# Patient Record
Sex: Female | Born: 1975
Health system: Southern US, Community
[De-identification: ages and names within clinical notes are randomized; demographics above are authoritative.]

## PROBLEM LIST (undated history)

## (undated) DIAGNOSIS — E7800 Pure hypercholesterolemia, unspecified: Secondary | ICD-10-CM

## (undated) DIAGNOSIS — K219 Gastro-esophageal reflux disease without esophagitis: Secondary | ICD-10-CM

## (undated) DIAGNOSIS — I10 Essential (primary) hypertension: Secondary | ICD-10-CM

## (undated) DIAGNOSIS — Z8041 Family history of malignant neoplasm of ovary: Secondary | ICD-10-CM

## (undated) DIAGNOSIS — E876 Hypokalemia: Secondary | ICD-10-CM

## (undated) DIAGNOSIS — E66812 Obesity, class 2: Secondary | ICD-10-CM

## (undated) HISTORY — PX: COLONOSCOPY W/ POLYPECTOMY: SHX1380

## (undated) HISTORY — DX: Pure hypercholesterolemia, unspecified: E78.00

## (undated) HISTORY — DX: Gastro-esophageal reflux disease without esophagitis: K21.9

## (undated) HISTORY — PX: ESOPHAGOGASTRODUODENOSCOPY: SHX1529

## (undated) HISTORY — DX: Obesity, class 2: E66.812

## (undated) HISTORY — DX: Essential (primary) hypertension: I10

## (undated) HISTORY — DX: Family history of malignant neoplasm of ovary: Z80.41

## (undated) HISTORY — PX: OTHER SURGICAL HISTORY: SHX169

## (undated) HISTORY — DX: Hypokalemia: E87.6

---

## 1998-02-26 HISTORY — PX: CHOLECYSTECTOMY: SHX55

## 2014-03-22 DIAGNOSIS — Z8041 Family history of malignant neoplasm of ovary: Secondary | ICD-10-CM | POA: Insufficient documentation

## 2015-02-15 DIAGNOSIS — R079 Chest pain, unspecified: Secondary | ICD-10-CM | POA: Insufficient documentation

## 2018-05-24 MED FILL — TRI FEMYNOR 28 TABLET: 0.18/0.215/ | 84 days supply | Qty: 84 | Fill #0

## 2018-08-11 DIAGNOSIS — Z01419 Encounter for gynecological examination (general) (routine) without abnormal findings: Secondary | ICD-10-CM | POA: Diagnosis not present

## 2018-08-11 DIAGNOSIS — Z1273 Encounter for screening for malignant neoplasm of ovary: Secondary | ICD-10-CM | POA: Diagnosis not present

## 2018-08-11 DIAGNOSIS — Z8041 Family history of malignant neoplasm of ovary: Secondary | ICD-10-CM | POA: Diagnosis not present

## 2018-08-11 DIAGNOSIS — Z1502 Genetic susceptibility to malignant neoplasm of ovary: Secondary | ICD-10-CM | POA: Diagnosis not present

## 2018-08-11 MED FILL — TRI FEMYNOR 28 TABLET: 0.18/0.215/ | 84 days supply | Qty: 84 | Fill #0

## 2018-10-08 DIAGNOSIS — Z1231 Encounter for screening mammogram for malignant neoplasm of breast: Secondary | ICD-10-CM | POA: Diagnosis not present

## 2018-12-01 MED FILL — NORGESTIM-ETH ESTRAD TRIPHA: 0.18/0.215/ | 84 days supply | Qty: 84 | Fill #1

## 2018-12-12 DIAGNOSIS — S61202A Unspecified open wound of right middle finger without damage to nail, initial encounter: Secondary | ICD-10-CM | POA: Diagnosis not present

## 2018-12-15 DIAGNOSIS — Z130 Encounter for screening for diseases of the blood and blood-forming organs and certain disorders involving the immune mechanism: Secondary | ICD-10-CM | POA: Diagnosis not present

## 2018-12-15 DIAGNOSIS — Z0000 Encounter for general adult medical examination without abnormal findings: Secondary | ICD-10-CM | POA: Diagnosis not present

## 2018-12-15 DIAGNOSIS — Z13220 Encounter for screening for lipoid disorders: Secondary | ICD-10-CM | POA: Diagnosis not present

## 2018-12-15 DIAGNOSIS — Z1329 Encounter for screening for other suspected endocrine disorder: Secondary | ICD-10-CM | POA: Diagnosis not present

## 2018-12-15 DIAGNOSIS — Z13228 Encounter for screening for other metabolic disorders: Secondary | ICD-10-CM | POA: Diagnosis not present

## 2018-12-29 DIAGNOSIS — Z136 Encounter for screening for cardiovascular disorders: Secondary | ICD-10-CM | POA: Diagnosis not present

## 2018-12-29 DIAGNOSIS — Z23 Encounter for immunization: Secondary | ICD-10-CM | POA: Diagnosis not present

## 2018-12-29 DIAGNOSIS — R079 Chest pain, unspecified: Secondary | ICD-10-CM | POA: Diagnosis not present

## 2018-12-29 DIAGNOSIS — Z0000 Encounter for general adult medical examination without abnormal findings: Secondary | ICD-10-CM | POA: Diagnosis not present

## 2018-12-29 LAB — VITAMIN B12: Vitamin B-12: 200

## 2018-12-30 ENCOUNTER — Other Ambulatory Visit (HOSPITAL_BASED_OUTPATIENT_CLINIC_OR_DEPARTMENT_OTHER): Payer: Self-pay | Admitting: Family Medicine

## 2018-12-30 DIAGNOSIS — M7989 Other specified soft tissue disorders: Secondary | ICD-10-CM

## 2018-12-30 DIAGNOSIS — I498 Other specified cardiac arrhythmias: Secondary | ICD-10-CM | POA: Diagnosis not present

## 2018-12-30 DIAGNOSIS — M25572 Pain in left ankle and joints of left foot: Secondary | ICD-10-CM

## 2018-12-30 DIAGNOSIS — S93402A Sprain of unspecified ligament of left ankle, initial encounter: Secondary | ICD-10-CM

## 2018-12-31 ENCOUNTER — Ambulatory Visit (HOSPITAL_BASED_OUTPATIENT_CLINIC_OR_DEPARTMENT_OTHER)
Admission: RE | Admit: 2018-12-31 | Discharge: 2018-12-31 | Disposition: A | Discharge status: Home / Self Care | Payer: 59 | Source: Ambulatory Visit | Attending: Family Medicine | Admitting: Family Medicine

## 2018-12-31 ENCOUNTER — Other Ambulatory Visit: Payer: Self-pay

## 2018-12-31 DIAGNOSIS — M7989 Other specified soft tissue disorders: Secondary | ICD-10-CM | POA: Insufficient documentation

## 2018-12-31 DIAGNOSIS — M25572 Pain in left ankle and joints of left foot: Secondary | ICD-10-CM | POA: Diagnosis not present

## 2018-12-31 DIAGNOSIS — S93402A Sprain of unspecified ligament of left ankle, initial encounter: Secondary | ICD-10-CM | POA: Diagnosis not present

## 2018-12-31 DIAGNOSIS — R2231 Localized swelling, mass and lump, right upper limb: Secondary | ICD-10-CM | POA: Diagnosis not present

## 2019-01-20 DIAGNOSIS — R2231 Localized swelling, mass and lump, right upper limb: Secondary | ICD-10-CM | POA: Diagnosis not present

## 2019-01-20 DIAGNOSIS — D2111 Benign neoplasm of connective and other soft tissue of right upper limb, including shoulder: Secondary | ICD-10-CM | POA: Diagnosis not present

## 2019-02-11 MED FILL — NORGESTIM-ETH ESTRAD TRIPHA: 0.18/0.215/ | 84 days supply | Qty: 84 | Fill #2

## 2019-03-25 DIAGNOSIS — H524 Presbyopia: Secondary | ICD-10-CM | POA: Diagnosis not present

## 2019-03-25 DIAGNOSIS — H52221 Regular astigmatism, right eye: Secondary | ICD-10-CM | POA: Diagnosis not present

## 2019-04-23 DIAGNOSIS — M722 Plantar fascial fibromatosis: Secondary | ICD-10-CM | POA: Diagnosis not present

## 2019-04-23 DIAGNOSIS — M7752 Other enthesopathy of left foot: Secondary | ICD-10-CM | POA: Diagnosis not present

## 2019-04-23 DIAGNOSIS — M7732 Calcaneal spur, left foot: Secondary | ICD-10-CM | POA: Diagnosis not present

## 2019-04-28 DIAGNOSIS — Z8041 Family history of malignant neoplasm of ovary: Secondary | ICD-10-CM | POA: Diagnosis not present

## 2019-06-26 MED FILL — NORGESTIM-ETH ESTRAD TRIPHA: 0.18/0.215/ | 84 days supply | Qty: 84 | Fill #3

## 2019-09-21 ENCOUNTER — Other Ambulatory Visit (HOSPITAL_COMMUNITY): Payer: Self-pay | Admitting: Obstetrics & Gynecology

## 2019-09-21 DIAGNOSIS — Z124 Encounter for screening for malignant neoplasm of cervix: Secondary | ICD-10-CM | POA: Diagnosis not present

## 2019-09-21 DIAGNOSIS — D259 Leiomyoma of uterus, unspecified: Secondary | ICD-10-CM | POA: Diagnosis not present

## 2019-09-21 DIAGNOSIS — Z8041 Family history of malignant neoplasm of ovary: Secondary | ICD-10-CM | POA: Diagnosis not present

## 2019-09-21 DIAGNOSIS — Z01419 Encounter for gynecological examination (general) (routine) without abnormal findings: Secondary | ICD-10-CM | POA: Diagnosis not present

## 2019-09-21 MED FILL — NORGESTIM-ETH ESTRAD TRIPHA: 0.18/0.215/ | 84 days supply | Qty: 84 | Fill #0

## 2019-10-08 DIAGNOSIS — M722 Plantar fascial fibromatosis: Secondary | ICD-10-CM | POA: Diagnosis not present

## 2019-10-08 DIAGNOSIS — M7732 Calcaneal spur, left foot: Secondary | ICD-10-CM | POA: Diagnosis not present

## 2019-10-08 DIAGNOSIS — M7752 Other enthesopathy of left foot: Secondary | ICD-10-CM | POA: Diagnosis not present

## 2019-11-05 DIAGNOSIS — M7752 Other enthesopathy of left foot: Secondary | ICD-10-CM | POA: Diagnosis not present

## 2019-11-05 DIAGNOSIS — M722 Plantar fascial fibromatosis: Secondary | ICD-10-CM | POA: Diagnosis not present

## 2019-11-05 DIAGNOSIS — M7732 Calcaneal spur, left foot: Secondary | ICD-10-CM | POA: Diagnosis not present

## 2019-11-12 DIAGNOSIS — Z1231 Encounter for screening mammogram for malignant neoplasm of breast: Secondary | ICD-10-CM | POA: Diagnosis not present

## 2019-11-28 IMAGING — US US EXTREM UP *R* LTD
1 series · 13 of 13 positions shown · non-contrast
Comparison: None.

CLINICAL DATA: Soft tissue mass of the right elbow.

EXAM:
ULTRASOUND RIGHT UPPER EXTREMITY LIMITED
TECHNIQUE: Ultrasound examination of the upper extremity soft tissues was
performed in the area of clinical concern.

[Series 1: us extrem up *right* ltd · 13 acquisitions, 13 frames shown]
[im 1/13]
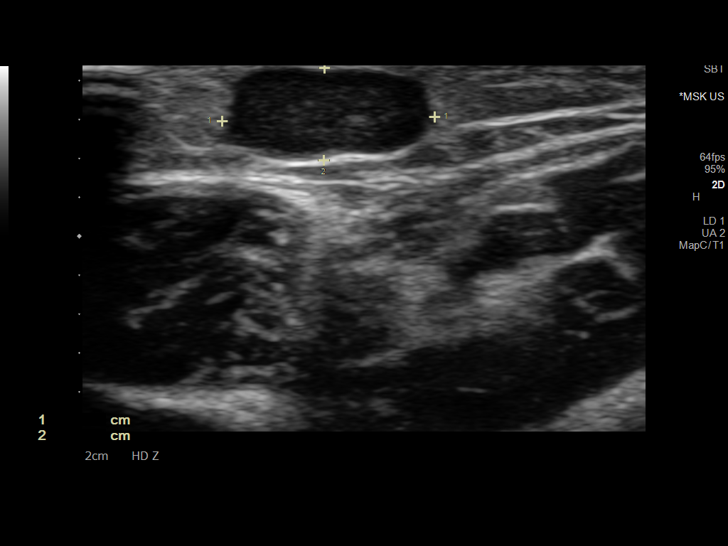
[im 2/13]
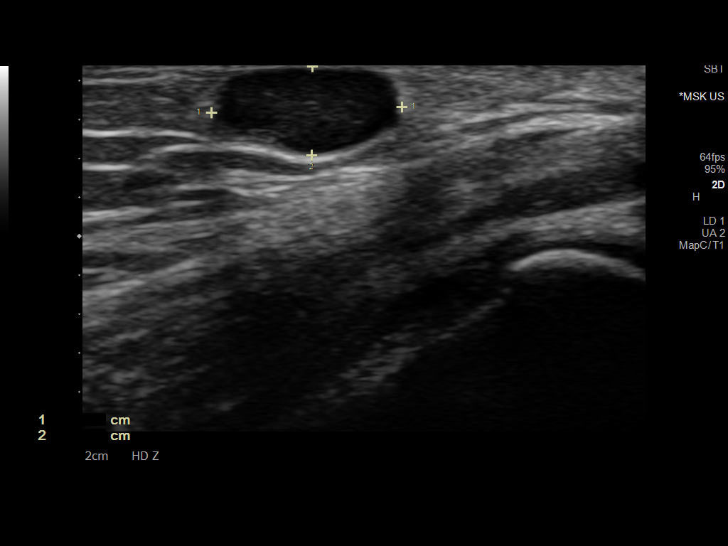
[im 3/13]
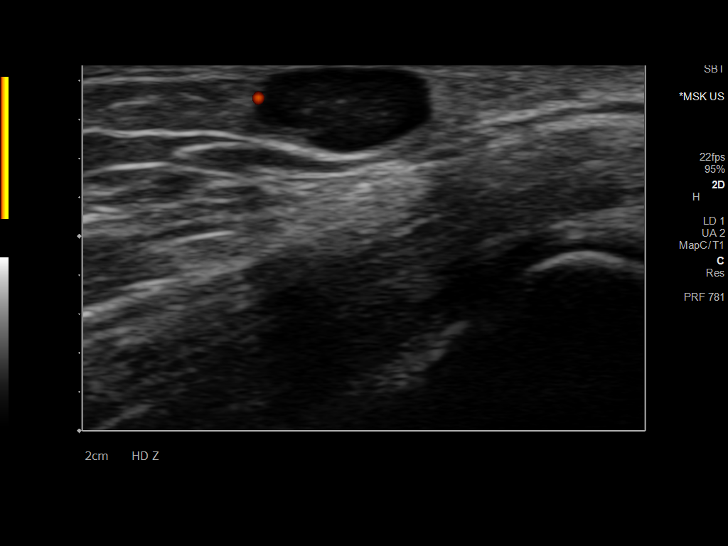
[im 4/13]
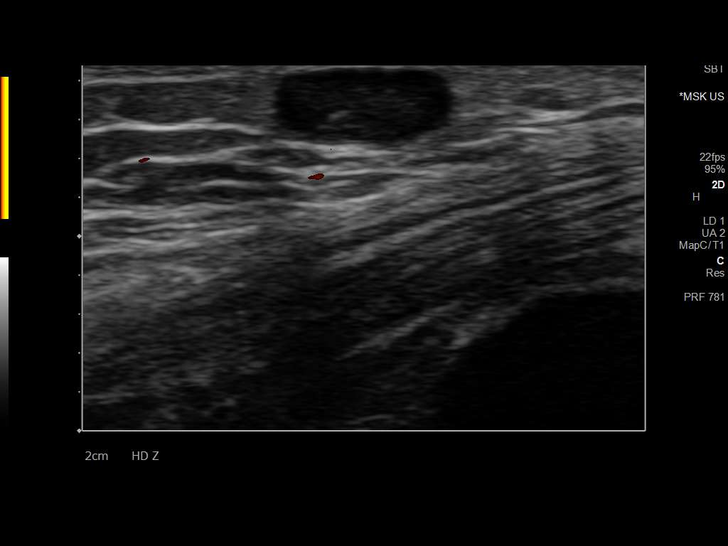
[im 5/13]
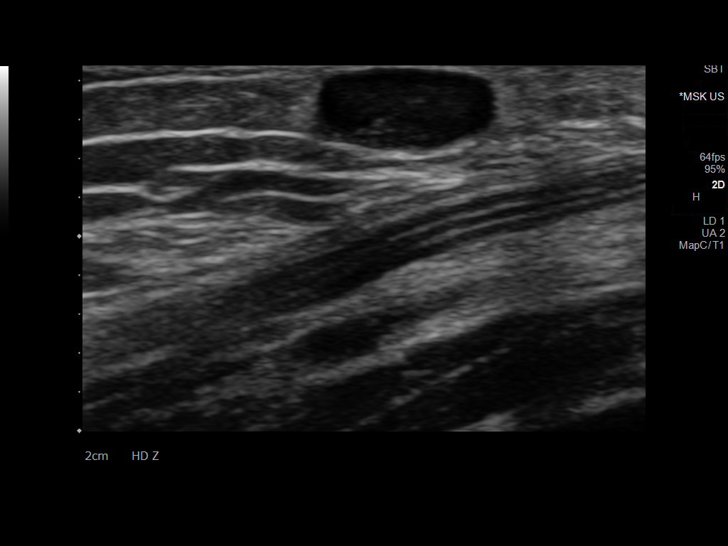
[im 6/13]
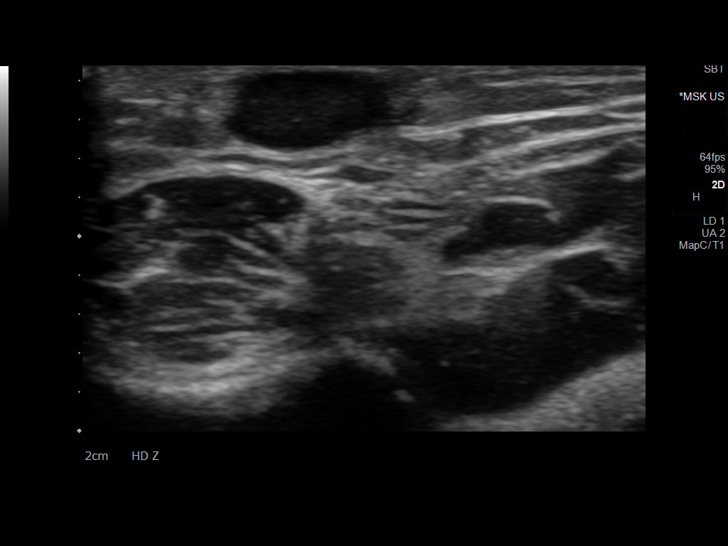
[im 7/13]
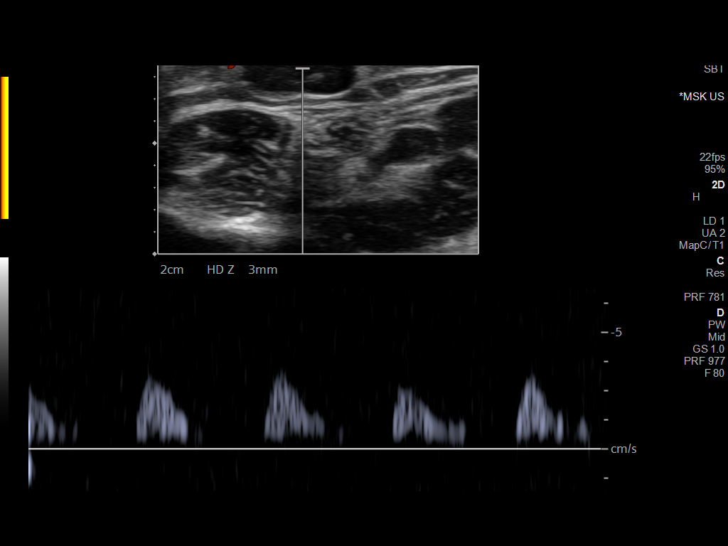
[im 8/13]
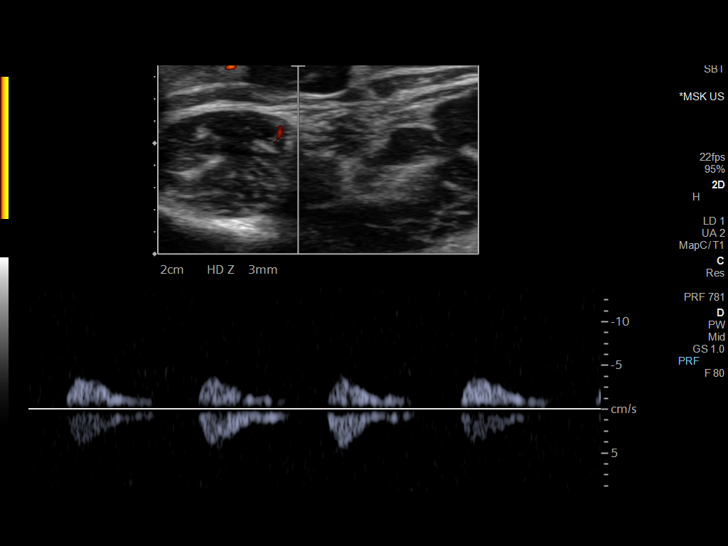
[im 9/13]
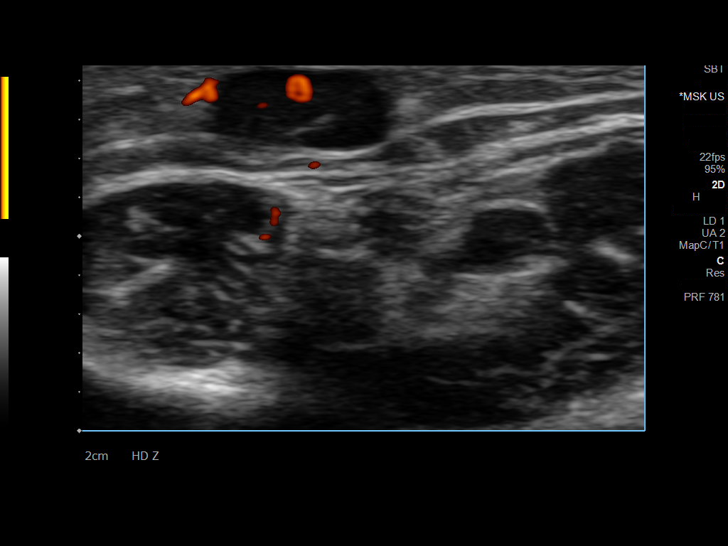
[im 10/13]
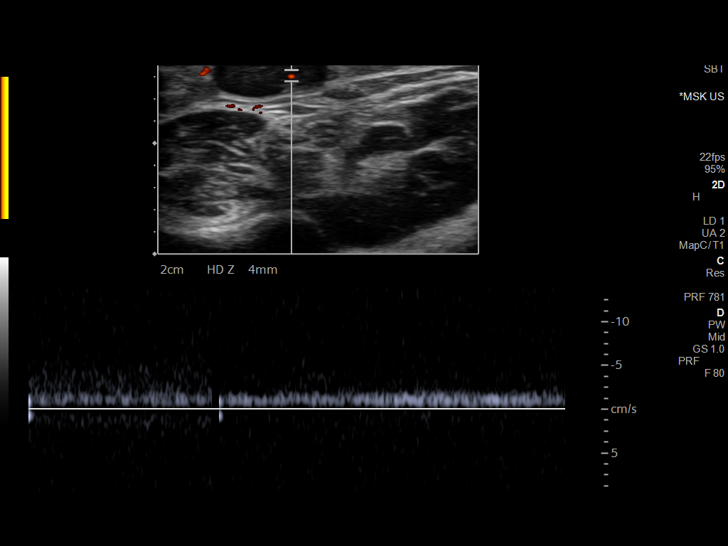
[im 11/13]
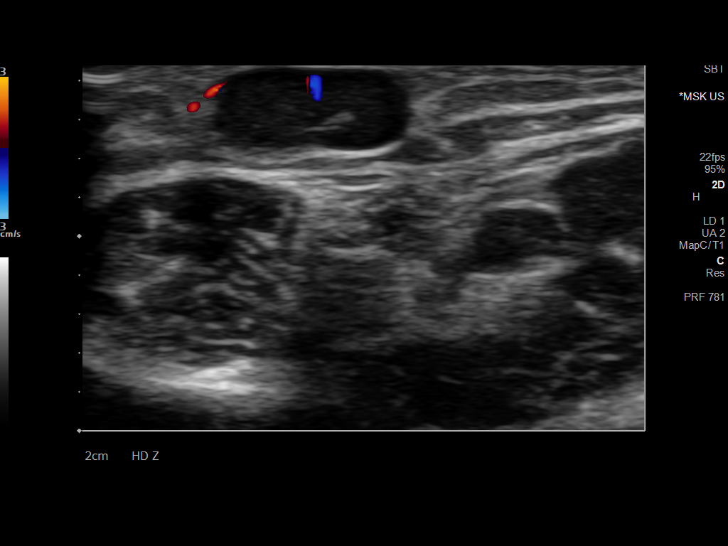
[im 12/13]
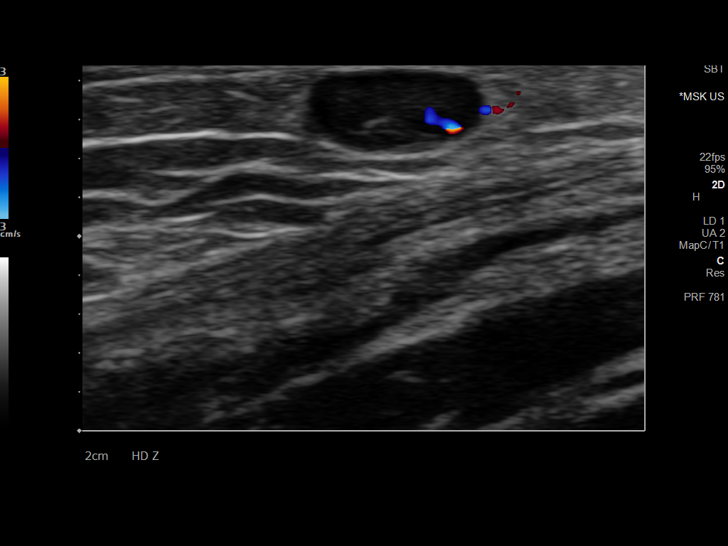
[im 13/13]
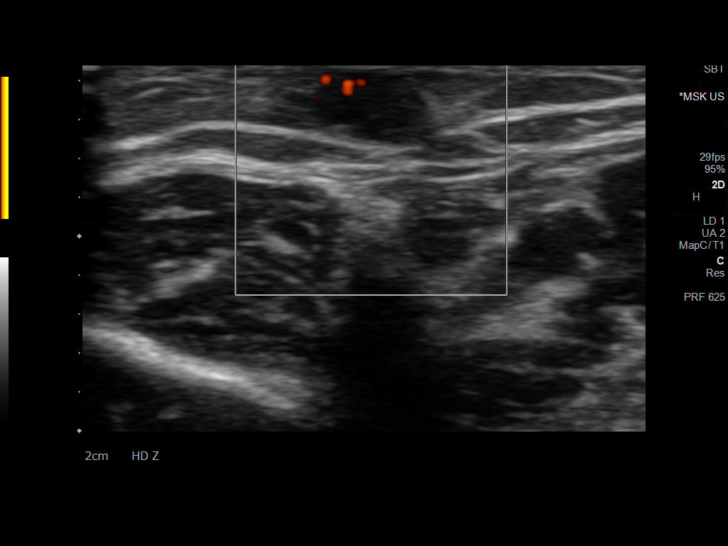

[13 of 13 positions shown; findings below may reference images not displayed]

FINDINGS: There is a well-defined 1.1 x 1.0 x 0.5 cm hypoechoic vascular mass
in the subcutaneous fat at the posterior aspect of the right elbow.
There is a tiny fatty hilum suggestive of a lymph node.

No other abnormality.
IMPRESSION: The soft tissue mass at the right elbow is consistent with an
enlarged lymph node.

## 2019-12-10 DIAGNOSIS — M7732 Calcaneal spur, left foot: Secondary | ICD-10-CM | POA: Diagnosis not present

## 2019-12-10 DIAGNOSIS — M722 Plantar fascial fibromatosis: Secondary | ICD-10-CM | POA: Diagnosis not present

## 2019-12-21 MED FILL — NORGESTIM-ETH ESTRAD TRIPHA: 0.18/0.215/ | 84 days supply | Qty: 84 | Fill #1

## 2019-12-22 ENCOUNTER — Other Ambulatory Visit: Payer: Self-pay

## 2019-12-22 DIAGNOSIS — Z20822 Contact with and (suspected) exposure to covid-19: Secondary | ICD-10-CM | POA: Diagnosis not present

## 2019-12-24 LAB — SARS-COV-2, NAA 2 DAY TAT

## 2019-12-24 LAB — NOVEL CORONAVIRUS, NAA: SARS-CoV-2, NAA: NOT DETECTED

## 2019-12-25 DIAGNOSIS — Z131 Encounter for screening for diabetes mellitus: Secondary | ICD-10-CM | POA: Diagnosis not present

## 2019-12-25 DIAGNOSIS — Z130 Encounter for screening for diseases of the blood and blood-forming organs and certain disorders involving the immune mechanism: Secondary | ICD-10-CM | POA: Diagnosis not present

## 2019-12-25 DIAGNOSIS — Z0001 Encounter for general adult medical examination with abnormal findings: Secondary | ICD-10-CM | POA: Diagnosis not present

## 2019-12-25 DIAGNOSIS — Z13220 Encounter for screening for lipoid disorders: Secondary | ICD-10-CM | POA: Diagnosis not present

## 2019-12-25 DIAGNOSIS — Z13228 Encounter for screening for other metabolic disorders: Secondary | ICD-10-CM | POA: Diagnosis not present

## 2019-12-25 DIAGNOSIS — Z0000 Encounter for general adult medical examination without abnormal findings: Secondary | ICD-10-CM | POA: Diagnosis not present

## 2019-12-25 DIAGNOSIS — K219 Gastro-esophageal reflux disease without esophagitis: Secondary | ICD-10-CM | POA: Diagnosis not present

## 2019-12-25 DIAGNOSIS — Z1321 Encounter for screening for nutritional disorder: Secondary | ICD-10-CM | POA: Diagnosis not present

## 2019-12-25 DIAGNOSIS — Z1329 Encounter for screening for other suspected endocrine disorder: Secondary | ICD-10-CM | POA: Diagnosis not present

## 2019-12-25 DIAGNOSIS — Z79899 Other long term (current) drug therapy: Secondary | ICD-10-CM | POA: Diagnosis not present

## 2020-01-11 DIAGNOSIS — R9431 Abnormal electrocardiogram [ECG] [EKG]: Secondary | ICD-10-CM | POA: Diagnosis not present

## 2020-01-11 DIAGNOSIS — Z0000 Encounter for general adult medical examination without abnormal findings: Secondary | ICD-10-CM | POA: Diagnosis not present

## 2020-02-17 DIAGNOSIS — Z03818 Encounter for observation for suspected exposure to other biological agents ruled out: Secondary | ICD-10-CM | POA: Diagnosis not present

## 2020-02-17 DIAGNOSIS — Z20822 Contact with and (suspected) exposure to covid-19: Secondary | ICD-10-CM | POA: Diagnosis not present

## 2020-02-17 DIAGNOSIS — Z792 Long term (current) use of antibiotics: Secondary | ICD-10-CM | POA: Diagnosis not present

## 2020-03-12 MED FILL — NORGESTIM-ETH ESTRAD TRIPHA: 0.18/0.215/ | 84 days supply | Qty: 84 | Fill #2

## 2020-03-17 ENCOUNTER — Ambulatory Visit: Payer: Self-pay

## 2020-03-17 ENCOUNTER — Other Ambulatory Visit: Payer: Self-pay | Admitting: Family Medicine

## 2020-03-17 ENCOUNTER — Other Ambulatory Visit: Payer: Self-pay

## 2020-03-17 DIAGNOSIS — M79645 Pain in left finger(s): Secondary | ICD-10-CM

## 2020-03-21 DIAGNOSIS — Z8041 Family history of malignant neoplasm of ovary: Secondary | ICD-10-CM | POA: Diagnosis not present

## 2020-03-21 DIAGNOSIS — Z7689 Persons encountering health services in other specified circumstances: Secondary | ICD-10-CM | POA: Diagnosis not present

## 2020-03-28 MED FILL — NORGESTIM-ETH ESTRAD TRIPHA: 0.18/0.215/ | 84 days supply | Qty: 84 | Fill #2

## 2020-04-06 DIAGNOSIS — H52223 Regular astigmatism, bilateral: Secondary | ICD-10-CM | POA: Diagnosis not present

## 2020-04-06 DIAGNOSIS — H524 Presbyopia: Secondary | ICD-10-CM | POA: Diagnosis not present

## 2020-07-05 ENCOUNTER — Other Ambulatory Visit (HOSPITAL_COMMUNITY): Payer: Self-pay

## 2020-07-05 MED FILL — Norgestimate-Eth Estrad Tab 0.18-35/0.215-35/0.25-35 MG-MCG: ORAL | 84 days supply | Qty: 84 | Fill #0 | Status: AC

## 2020-07-06 ENCOUNTER — Other Ambulatory Visit (HOSPITAL_COMMUNITY): Payer: Self-pay

## 2020-07-14 ENCOUNTER — Other Ambulatory Visit (HOSPITAL_COMMUNITY): Payer: Self-pay

## 2020-07-14 MED ORDER — CARESTART COVID-19 HOME TEST VI KIT
PACK | 0 refills | Status: DC
  Filled 2020-07-14: qty 4, 4d supply, fill #0

## 2020-08-04 LAB — RESULTS CONSOLE HPV: CHL HPV: NEGATIVE

## 2020-10-04 DIAGNOSIS — Z1273 Encounter for screening for malignant neoplasm of ovary: Secondary | ICD-10-CM | POA: Diagnosis not present

## 2020-10-04 DIAGNOSIS — Z124 Encounter for screening for malignant neoplasm of cervix: Secondary | ICD-10-CM | POA: Diagnosis not present

## 2020-10-04 DIAGNOSIS — Z8041 Family history of malignant neoplasm of ovary: Secondary | ICD-10-CM | POA: Diagnosis not present

## 2020-10-04 DIAGNOSIS — Z01419 Encounter for gynecological examination (general) (routine) without abnormal findings: Secondary | ICD-10-CM | POA: Diagnosis not present

## 2020-10-04 DIAGNOSIS — Z6835 Body mass index (BMI) 35.0-35.9, adult: Secondary | ICD-10-CM | POA: Diagnosis not present

## 2020-10-04 DIAGNOSIS — Z1151 Encounter for screening for human papillomavirus (HPV): Secondary | ICD-10-CM | POA: Diagnosis not present

## 2020-10-04 LAB — HM PAP SMEAR

## 2020-10-07 LAB — HM PAP SMEAR
HPV Aptima: NEGATIVE
Pap: NEGATIVE
Pap: NEGATIVE

## 2020-10-13 DIAGNOSIS — I10 Essential (primary) hypertension: Secondary | ICD-10-CM | POA: Diagnosis not present

## 2020-11-01 DIAGNOSIS — I10 Essential (primary) hypertension: Secondary | ICD-10-CM | POA: Diagnosis not present

## 2020-11-08 ENCOUNTER — Other Ambulatory Visit (HOSPITAL_COMMUNITY): Payer: Self-pay

## 2020-11-08 MED ORDER — CARESTART COVID-19 HOME TEST VI KIT
PACK | 0 refills | Status: DC
  Filled 2020-11-08 – 2020-12-01 (×2): qty 4, 4d supply, fill #0

## 2020-11-17 ENCOUNTER — Other Ambulatory Visit (HOSPITAL_COMMUNITY): Payer: Self-pay

## 2020-11-17 MED ORDER — LOSARTAN POTASSIUM-HCTZ 50-12.5 MG PO TABS
1.0000 | ORAL_TABLET | Freq: Every day | ORAL | 2 refills | Status: DC
  Filled 2020-11-17 – 2020-12-01 (×2): qty 30, 30d supply, fill #0

## 2020-11-25 ENCOUNTER — Other Ambulatory Visit (HOSPITAL_COMMUNITY): Payer: Self-pay

## 2020-12-01 ENCOUNTER — Other Ambulatory Visit (HOSPITAL_COMMUNITY): Payer: Self-pay

## 2021-01-09 DIAGNOSIS — Z1329 Encounter for screening for other suspected endocrine disorder: Secondary | ICD-10-CM | POA: Diagnosis not present

## 2021-01-09 DIAGNOSIS — I10 Essential (primary) hypertension: Secondary | ICD-10-CM | POA: Diagnosis not present

## 2021-01-09 DIAGNOSIS — Z1321 Encounter for screening for nutritional disorder: Secondary | ICD-10-CM | POA: Diagnosis not present

## 2021-01-09 DIAGNOSIS — Z13220 Encounter for screening for lipoid disorders: Secondary | ICD-10-CM | POA: Diagnosis not present

## 2021-01-09 DIAGNOSIS — Z131 Encounter for screening for diabetes mellitus: Secondary | ICD-10-CM | POA: Diagnosis not present

## 2021-01-09 DIAGNOSIS — Z0000 Encounter for general adult medical examination without abnormal findings: Secondary | ICD-10-CM | POA: Diagnosis not present

## 2021-01-09 LAB — CBC AND DIFFERENTIAL
HCT: 40 (ref 36–46)
Hemoglobin: 13.4 (ref 12.0–16.0)
Neutrophils Absolute: 3.5
Platelets: 150 10*3/uL (ref 150–400)
WBC: 6.2

## 2021-01-09 LAB — HEMOGLOBIN A1C: Hemoglobin A1C: 5.5

## 2021-01-09 LAB — BASIC METABOLIC PANEL
BUN: 10 (ref 4–21)
CO2: 27 — AB (ref 13–22)
Chloride: 104 (ref 99–108)
Creatinine: 0.9 (ref 0.5–1.1)
Glucose: 69
Potassium: 3.9 mEq/L (ref 3.5–5.1)
Sodium: 139 (ref 137–147)

## 2021-01-09 LAB — COMPREHENSIVE METABOLIC PANEL
Albumin: 4.3 (ref 3.5–5.0)
Calcium: 9.5 (ref 8.7–10.7)
eGFR: 83

## 2021-01-09 LAB — HEPATIC FUNCTION PANEL
ALT: 16 U/L (ref 7–35)
AST: 18 (ref 13–35)
Alkaline Phosphatase: 56 (ref 25–125)
Bilirubin, Total: 0.5

## 2021-01-09 LAB — VITAMIN D 25 HYDROXY (VIT D DEFICIENCY, FRACTURES): Vit D, 25-Hydroxy: 26

## 2021-01-09 LAB — LIPID PANEL
Cholesterol: 182 (ref 0–200)
HDL: 57 (ref 35–70)
LDL Cholesterol: 114
Triglycerides: 75 (ref 40–160)

## 2021-01-09 LAB — CBC: RBC: 4.76 (ref 3.87–5.11)

## 2021-01-09 LAB — TSH: TSH: 1.72 (ref 0.41–5.90)

## 2021-01-12 ENCOUNTER — Other Ambulatory Visit (HOSPITAL_COMMUNITY): Payer: Self-pay

## 2021-01-12 DIAGNOSIS — Z0000 Encounter for general adult medical examination without abnormal findings: Secondary | ICD-10-CM | POA: Diagnosis not present

## 2021-01-12 DIAGNOSIS — Z136 Encounter for screening for cardiovascular disorders: Secondary | ICD-10-CM | POA: Diagnosis not present

## 2021-01-12 DIAGNOSIS — I10 Essential (primary) hypertension: Secondary | ICD-10-CM | POA: Diagnosis not present

## 2021-01-12 DIAGNOSIS — Z1231 Encounter for screening mammogram for malignant neoplasm of breast: Secondary | ICD-10-CM | POA: Diagnosis not present

## 2021-01-12 MED ORDER — HYDROCHLOROTHIAZIDE 12.5 MG PO CAPS
12.5000 mg | ORAL_CAPSULE | Freq: Every day | ORAL | 3 refills | Status: DC
  Filled 2021-01-12: qty 90, 90d supply, fill #0
  Filled 2021-12-27: qty 90, 90d supply, fill #1

## 2021-01-16 ENCOUNTER — Other Ambulatory Visit (HOSPITAL_BASED_OUTPATIENT_CLINIC_OR_DEPARTMENT_OTHER): Payer: Self-pay

## 2021-01-16 MED ORDER — OZEMPIC (0.25 OR 0.5 MG/DOSE) 2 MG/1.5ML ~~LOC~~ SOPN
PEN_INJECTOR | SUBCUTANEOUS | 3 refills | Status: DC
  Filled 2021-01-16: qty 1.5, 30d supply, fill #0
  Filled 2021-03-05: qty 4.5, 84d supply, fill #1

## 2021-02-12 IMAGING — DX DG FINGER RING 2+V*L*
3 series · 3 of 3 positions shown · non-contrast
Comparison: None.

CLINICAL DATA: Crush injury

EXAM:
LEFT FOURTH FINGER 2+V

[finger pa]
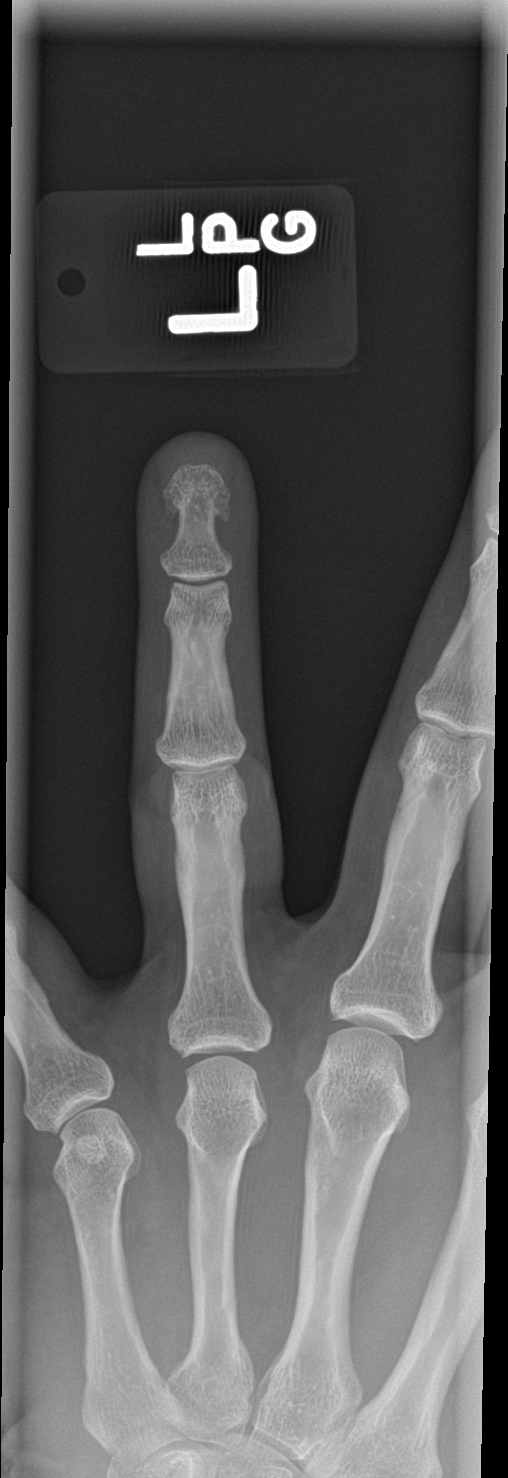

[finger obl]
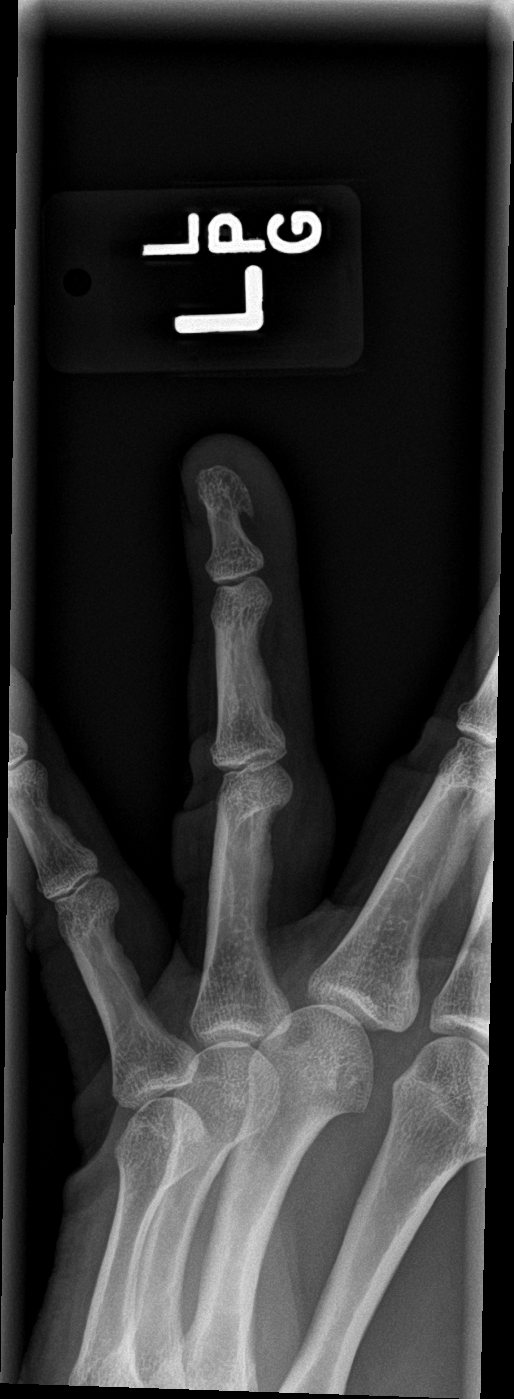

[finger lat]
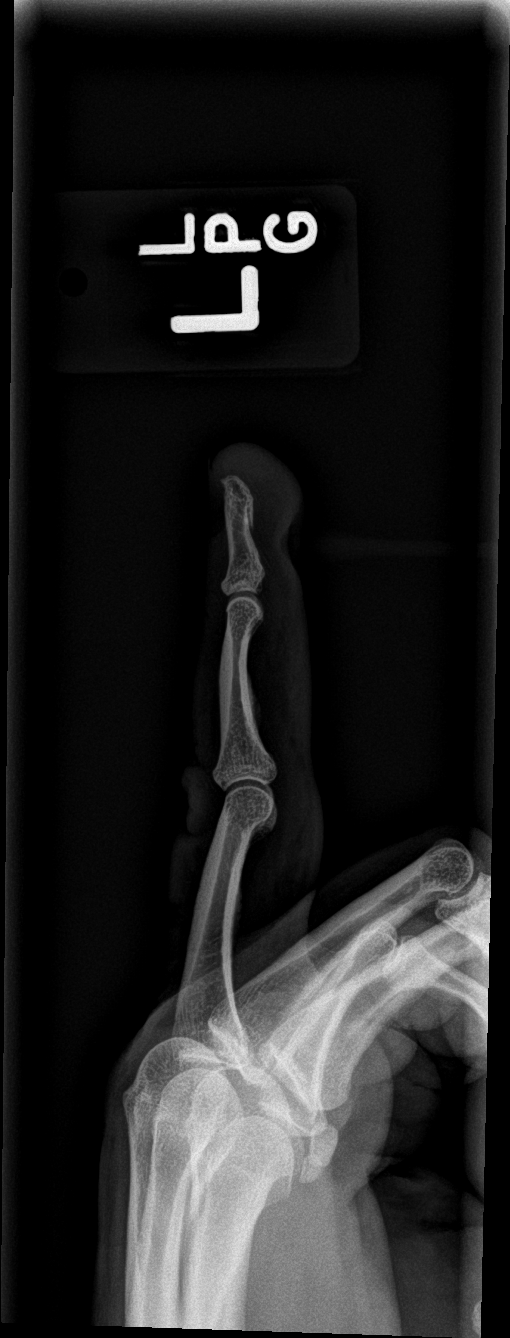

[3 of 3 positions shown; findings below may reference images not displayed]

FINDINGS: Frontal, oblique, and lateral views were obtained. No fracture or
dislocation. Joint spaces appear normal. No erosive change.
IMPRESSION: No fracture or dislocation.  No evident arthropathy.

## 2021-03-06 ENCOUNTER — Other Ambulatory Visit (HOSPITAL_BASED_OUTPATIENT_CLINIC_OR_DEPARTMENT_OTHER): Payer: Self-pay

## 2021-05-16 DIAGNOSIS — J0100 Acute maxillary sinusitis, unspecified: Secondary | ICD-10-CM | POA: Insufficient documentation

## 2021-05-16 DIAGNOSIS — I10 Essential (primary) hypertension: Secondary | ICD-10-CM | POA: Insufficient documentation

## 2021-05-25 DIAGNOSIS — Z8041 Family history of malignant neoplasm of ovary: Secondary | ICD-10-CM | POA: Diagnosis not present

## 2021-06-29 DIAGNOSIS — H5203 Hypermetropia, bilateral: Secondary | ICD-10-CM | POA: Diagnosis not present

## 2021-06-29 DIAGNOSIS — H52221 Regular astigmatism, right eye: Secondary | ICD-10-CM | POA: Diagnosis not present

## 2021-06-29 DIAGNOSIS — L570 Actinic keratosis: Secondary | ICD-10-CM | POA: Diagnosis not present

## 2021-06-29 DIAGNOSIS — L821 Other seborrheic keratosis: Secondary | ICD-10-CM | POA: Diagnosis not present

## 2021-06-29 DIAGNOSIS — L853 Xerosis cutis: Secondary | ICD-10-CM | POA: Diagnosis not present

## 2021-08-02 ENCOUNTER — Ambulatory Visit (INDEPENDENT_AMBULATORY_CARE_PROVIDER_SITE_OTHER): Payer: 59 | Admitting: Orthopaedic Surgery

## 2021-08-02 ENCOUNTER — Encounter: Payer: Self-pay | Admitting: Orthopaedic Surgery

## 2021-08-02 ENCOUNTER — Ambulatory Visit (INDEPENDENT_AMBULATORY_CARE_PROVIDER_SITE_OTHER): Payer: 59

## 2021-08-02 VITALS — Ht 63.75 in | Wt 192.6 lb

## 2021-08-02 DIAGNOSIS — M25552 Pain in left hip: Secondary | ICD-10-CM | POA: Diagnosis not present

## 2021-08-02 MED ORDER — DICLOFENAC SODIUM 75 MG PO TBEC: 75.0000 mg | DELAYED_RELEASE_TABLET | Freq: Two times a day (BID) | ORAL | 2 refills | Status: DC | PRN

## 2021-08-02 MED ORDER — PREDNISONE 50 MG PO TABS: ORAL_TABLET | ORAL | 0 refills | Status: DC

## 2021-08-02 NOTE — Progress Notes (Signed)
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  She comes in with acute left hip pain this been hurting since March when she was performing lunges and other squats and felt something happen in her left hip.  She has had some decreased motion and pain in the groin but also on the posterior lateral aspect of her left hip since then.  She had the groin pain is not as bad has been taken ibuprofen and try Voltaren gel.  She is also been taken some turmeric and is transition to Pilates instead of the higher impact exercises that she was performing.  In the past she has had diclofenac for her right hip issue did help that quite a bit.  She is heading out to [facility] soon in early July for hiking.  She has never had surgery on her hips either.  She is active and not a diabetic.  Both hips move smoothly and fluidly but the left hip is definitely painful at the extremes of rotation some in the groin but also in the posterior lateral aspect of the hip which can be concerning for partial tearing of the gluteus medius or minimus tendon.  Certainly there could be an issue with the labrum.  There is no blocks or rotation.  When I have her lay flat her leg lengths are equal.  When I have her turn on her side there is no significant pain over the IT band or the proximal trochanteric area except for the posterior aspect.  An AP pelvis and lateral of her left hip show normal appearing hip joint space bilaterally and no acute findings.  I am concerned there could be partial tearing of the gluteus medius or minimus tendon or something going on with the labrum.  We will start conservative first with 5 days of 50 mg prednisone as well as diclofenac.  My next step would her be considering steroid injections around her left hip 1 in the trochanteric area and potentially 1 in the hip joint itself.  She understands this as well and has my cell phone number and can text me if she decides to have any type of  injection.  All question concerns were answered and addressed.

## 2021-11-06 DIAGNOSIS — Z01419 Encounter for gynecological examination (general) (routine) without abnormal findings: Secondary | ICD-10-CM | POA: Diagnosis not present

## 2021-11-06 DIAGNOSIS — D251 Intramural leiomyoma of uterus: Secondary | ICD-10-CM | POA: Diagnosis not present

## 2021-11-06 DIAGNOSIS — N938 Other specified abnormal uterine and vaginal bleeding: Secondary | ICD-10-CM | POA: Diagnosis not present

## 2021-11-06 DIAGNOSIS — Z6833 Body mass index (BMI) 33.0-33.9, adult: Secondary | ICD-10-CM | POA: Diagnosis not present

## 2021-11-21 DIAGNOSIS — J209 Acute bronchitis, unspecified: Secondary | ICD-10-CM | POA: Diagnosis not present

## 2021-11-22 DIAGNOSIS — J209 Acute bronchitis, unspecified: Secondary | ICD-10-CM | POA: Diagnosis not present

## 2021-12-28 ENCOUNTER — Other Ambulatory Visit (HOSPITAL_COMMUNITY): Payer: Self-pay

## 2022-01-12 DIAGNOSIS — Z0000 Encounter for general adult medical examination without abnormal findings: Secondary | ICD-10-CM | POA: Diagnosis not present

## 2022-01-12 DIAGNOSIS — R079 Chest pain, unspecified: Secondary | ICD-10-CM | POA: Diagnosis not present

## 2022-01-16 ENCOUNTER — Other Ambulatory Visit (HOSPITAL_COMMUNITY): Payer: Self-pay

## 2022-01-16 MED ORDER — LOSARTAN POTASSIUM 100 MG PO TABS
100.0000 mg | ORAL_TABLET | Freq: Every day | ORAL | 3 refills | Status: DC
  Filled 2022-01-16: qty 90, 90d supply, fill #0

## 2022-02-01 ENCOUNTER — Other Ambulatory Visit (HOSPITAL_COMMUNITY): Payer: Self-pay

## 2022-03-07 ENCOUNTER — Encounter: Payer: Self-pay | Admitting: Gastroenterology

## 2022-03-13 LAB — HM MAMMOGRAPHY

## 2022-03-14 LAB — HM MAMMOGRAPHY

## 2022-03-30 ENCOUNTER — Encounter: Payer: Self-pay | Admitting: Family Medicine

## 2022-03-30 ENCOUNTER — Other Ambulatory Visit: Payer: Self-pay | Admitting: Family Medicine

## 2022-03-30 DIAGNOSIS — E876 Hypokalemia: Secondary | ICD-10-CM | POA: Insufficient documentation

## 2022-03-30 DIAGNOSIS — K219 Gastro-esophageal reflux disease without esophagitis: Secondary | ICD-10-CM | POA: Insufficient documentation

## 2022-03-30 DIAGNOSIS — N942 Vaginismus: Secondary | ICD-10-CM | POA: Insufficient documentation

## 2022-03-30 DIAGNOSIS — E538 Deficiency of other specified B group vitamins: Secondary | ICD-10-CM | POA: Insufficient documentation

## 2022-04-02 ENCOUNTER — Ambulatory Visit (INDEPENDENT_AMBULATORY_CARE_PROVIDER_SITE_OTHER): Payer: BLUE CROSS/BLUE SHIELD | Admitting: Family Medicine

## 2022-04-02 ENCOUNTER — Encounter: Payer: Self-pay | Admitting: Family Medicine

## 2022-04-02 VITALS — BP 107/76 | HR 77 | Temp 98.1°F | Ht 63.78 in | Wt 205.4 lb

## 2022-04-02 DIAGNOSIS — Z7689 Persons encountering health services in other specified circumstances: Secondary | ICD-10-CM

## 2022-04-02 DIAGNOSIS — I10 Essential (primary) hypertension: Secondary | ICD-10-CM | POA: Diagnosis not present

## 2022-04-02 DIAGNOSIS — Z8249 Family history of ischemic heart disease and other diseases of the circulatory system: Secondary | ICD-10-CM | POA: Diagnosis not present

## 2022-04-02 DIAGNOSIS — K219 Gastro-esophageal reflux disease without esophagitis: Secondary | ICD-10-CM

## 2022-04-02 DIAGNOSIS — Z823 Family history of stroke: Secondary | ICD-10-CM | POA: Diagnosis not present

## 2022-04-02 MED ORDER — OMEPRAZOLE 20 MG PO CPDR: 20.0000 mg | DELAYED_RELEASE_CAPSULE | Freq: Every day | ORAL | 1 refills | Status: DC

## 2022-04-02 MED ORDER — TRIAMTERENE-HCTZ 37.5-25 MG PO TABS: 1.0000 | ORAL_TABLET | Freq: Every day | ORAL | 1 refills | Status: DC

## 2022-04-02 NOTE — Progress Notes (Signed)
Patient ID: Makayla Kelly, female  DOB: 06-18-1975, 47 y.o.   MRN: [id number] Patient Care Team    Relationship Specialty Notifications Start End  Ma Hillock, DO PCP - General Family Medicine  04/02/22   Thornton Park, MD Consulting Physician Gastroenterology  03/30/22   Hollar, Katharine Look, MD Referring Physician Dermatology  04/02/22   Ablott, [facility]  04/02/22   Kerney Elbe, MD  Obstetrics and Gynecology  04/02/22     Chief Complaint  Patient presents with   Establish Care   Hypertension    Started maxzide on 001/09/24    Subjective: Makayla Kelly is a 47 y.o.  female present for new patient establishment- [facility]. All past medical history, surgical history, allergies, family history, immunizations, medications and social history were updated in the electronic medical record today. All recent labs, ED visits and hospitalizations within the last year were reviewed.  HTN:Pt reports compliance with maxzide. Blood pressures ranges at home normal. Patient denies chest pain, shortness of breath or lower extremity edema. Pt doesnot daily baby ASA. Pt is not prescribed statin. RF: HTN, FH heart disease, FH stroke Last BMP- hypokalemia 3.1       No data to display             No data to display                 No data to display          Immunization History  Administered Date(s) Administered   Influenza Split 11/27/2014   Influenza,inj,Quad PF,6+ Mos 12/20/2021   Influenza-Unspecified 11/28/2017, 11/18/2018   Tdap 12/29/2018    No results found.  Past Medical History:  Diagnosis Date   GERD (gastroesophageal reflux disease)    Hypertension    Allergies  Allergen Reactions   Erythromycin Other (See Comments) and Nausea And Vomiting   Past Surgical History:  Procedure Laterality Date   CHOLECYSTECTOMY  2000   2000   Family History  Problem Relation Age of Onset   Early death Mother    Ovarian cancer Mother    Hypertension  Father    Heart disease Father    Diabetes Father    Arthritis Father    Depression Brother    Stomach cancer Maternal Grandmother    Heart disease Maternal Grandfather    Heart attack Maternal Grandfather    Heart attack Paternal Grandmother    Diabetes Paternal Grandmother    Heart disease Paternal Grandmother    Stroke Paternal Grandfather    Heart disease Paternal Grandfather    Diabetes Paternal Grandfather    Social History   Social History Narrative   Marital status/children/pets: Married, 2 children   Education/employment: Masters degree, works as a Writer:      -Wears a bicycle helmet riding a bike: Yes     -smoke alarm in the home:Yes     - wears seatbelt: Yes     - Feels safe in their relationships: Yes       Allergies as of 04/02/2022       Reactions   Erythromycin Other (See Comments), Nausea And Vomiting        Medication List        Accurate as of April 02, 2022  3:54 PM. If you have any questions, ask your nurse or doctor.          fluticasone 50 MCG/ACT nasal spray Commonly known as: FLONASE Place into  both nostrils.   omeprazole 20 MG capsule Commonly known as: PRILOSEC Take 1 capsule (20 mg total) by mouth daily. What changed:  how much to take when to take this Changed by: Howard Pouch, DO   triamterene-hydrochlorothiazide 37.5-25 MG tablet Commonly known as: MAXZIDE-25 Take 1 tablet by mouth daily. What changed:  how much to take when to take this Changed by: Howard Pouch, DO        All past medical history, surgical history, allergies, family history, immunizations andmedications were updated in the EMR today and reviewed under the history and medication portions of their EMR.    Recent Results (from the past 2160 hour(s))  HM MAMMOGRAPHY     Status: None   Collection Time: 03/14/22 12:00 AM  Result Value Ref Range   HM Mammogram 0-4 Bi-Rad 0-4 Bi-Rad, Self Reported Normal     ROS 14 pt review of systems  performed and negative (unless mentioned in an HPI)  Objective: BP 107/76   Pulse 77   Temp 98.1 F (36.7 C)   Ht 5' 3.78" (1.62 m)   Wt 205 lb 6.4 oz (93.2 kg)   LMP 03/19/2022   SpO2 97%   BMI 35.50 kg/m  Physical Exam Vitals and nursing note reviewed.  Constitutional:      General: She is not in acute distress.    Appearance: Normal appearance. She is not ill-appearing, toxic-appearing or diaphoretic.  HENT:     Head: Normocephalic and atraumatic.  Eyes:     General: No scleral icterus.       Right eye: No discharge.        Left eye: No discharge.     Extraocular Movements: Extraocular movements intact.     Conjunctiva/sclera: Conjunctivae normal.     Pupils: Pupils are equal, round, and reactive to light.  Cardiovascular:     Rate and Rhythm: Normal rate and regular rhythm.  Pulmonary:     Effort: Pulmonary effort is normal. No respiratory distress.     Breath sounds: Normal breath sounds. No wheezing, rhonchi or rales.  Musculoskeletal:     Right lower leg: No edema.     Left lower leg: No edema.  Skin:    General: Skin is warm.     Findings: No rash.  Neurological:     Mental Status: She is alert and oriented to person, place, and time. Mental status is at baseline.     Motor: No weakness.     Gait: Gait normal.  Psychiatric:        Mood and Affect: Mood normal.        Behavior: Behavior normal.        Thought Content: Thought content normal.        Judgment: Judgment normal.      Assessment/plan: Makayla Kelly is a 47 y.o. female present for est care-[facility] Establishing care with new doctor, encounter for Primary hypertension/ FH: heart disease/ FH: stroke/hypokalemia  Stable Continue maxide as long as not hypokalemic - CT CARDIAC SCORING (SELF PAY ONLY); Future - Basic Metabolic Panel (BMET) Encourage heart healthy diet and routine exercise.  Gastroesophageal reflux disease without esophagitis Stable Continue omperazole 20 mg qd prn Encouraged B12  subL (last labs <400)   Return in about 24 weeks (around 09/17/2022) for Routine chronic condition follow-up.  Orders Placed This Encounter  Procedures   CT CARDIAC SCORING (SELF PAY ONLY)   Basic Metabolic Panel (BMET)   Meds ordered this encounter  Medications  triamterene-hydrochlorothiazide (MAXZIDE-25) 37.5-25 MG tablet    Sig: Take 1 tablet by mouth daily.    Dispense:  90 tablet    Refill:  1    New pcp   omeprazole (PRILOSEC) 20 MG capsule    Sig: Take 1 capsule (20 mg total) by mouth daily.    Dispense:  90 capsule    Refill:  1   Referral Orders  No referral(s) requested today     Note is dictated utilizing voice recognition software. Although note has been proof read prior to signing, occasional typographical errors still can be missed. If any questions arise, please do not hesitate to call for verification.  Electronically signed by: Howard Pouch, DO [place]

## 2022-04-02 NOTE — Patient Instructions (Addendum)
Return in about 24 weeks (around 09/17/2022) for Routine chronic condition follow-up.        Great to see you today.  I have refilled the medication(s) we provide.   If labs were collected, we will inform you of lab results once received either by echart message or telephone call.   - echart message- for normal results that have been seen by the patient already.   - telephone call: abnormal results or if patient has not viewed results in their echart.

## 2022-04-03 LAB — BASIC METABOLIC PANEL
BUN: 14 mg/dL (ref 7–25)
CO2: 21 mmol/L (ref 20–32)
Calcium: 9.4 mg/dL (ref 8.6–10.2)
Chloride: 101 mmol/L (ref 98–110)
Creat: 0.82 mg/dL (ref 0.50–0.99)
Glucose, Bld: 74 mg/dL (ref 65–99)
Potassium: 3.9 mmol/L (ref 3.5–5.3)
Sodium: 136 mmol/L (ref 135–146)

## 2022-04-10 ENCOUNTER — Ambulatory Visit (INDEPENDENT_AMBULATORY_CARE_PROVIDER_SITE_OTHER): Payer: Self-pay

## 2022-04-10 ENCOUNTER — Ambulatory Visit: Payer: Commercial Managed Care - PPO | Admitting: Gastroenterology

## 2022-04-10 DIAGNOSIS — Z8249 Family history of ischemic heart disease and other diseases of the circulatory system: Secondary | ICD-10-CM

## 2022-04-10 DIAGNOSIS — I10 Essential (primary) hypertension: Secondary | ICD-10-CM

## 2022-04-10 DIAGNOSIS — Z823 Family history of stroke: Secondary | ICD-10-CM

## 2022-04-13 ENCOUNTER — Telehealth: Payer: Self-pay | Admitting: Family Medicine

## 2022-04-13 NOTE — Telephone Encounter (Signed)
LM for pt to return call to discuss.  

## 2022-04-13 NOTE — Telephone Encounter (Signed)
Please inform pt her coronary calcium score is ZERO. That is wonderful and the best score possible.

## 2022-05-16 ENCOUNTER — Ambulatory Visit: Payer: Self-pay | Admitting: Gastroenterology

## 2022-07-03 ENCOUNTER — Other Ambulatory Visit: Payer: Self-pay | Admitting: Orthopaedic Surgery

## 2022-07-10 ENCOUNTER — Encounter: Payer: Self-pay | Admitting: Gastroenterology

## 2022-07-10 ENCOUNTER — Ambulatory Visit: Payer: BC Managed Care – PPO | Admitting: Gastroenterology

## 2022-07-10 VITALS — BP 122/80 | HR 66 | Ht 64.0 in | Wt 200.0 lb

## 2022-07-10 DIAGNOSIS — Z1211 Encounter for screening for malignant neoplasm of colon: Secondary | ICD-10-CM | POA: Diagnosis not present

## 2022-07-10 DIAGNOSIS — K219 Gastro-esophageal reflux disease without esophagitis: Secondary | ICD-10-CM | POA: Diagnosis not present

## 2022-07-10 MED ORDER — CLENPIQ 10-3.5-12 MG-GM -GM/175ML PO SOLN: 1.0000 | ORAL | 0 refills | Status: DC

## 2022-07-10 NOTE — Patient Instructions (Addendum)
_______________________________________________________  If your blood pressure at your visit was 140/90 or greater, please contact your primary care physician to follow up on this.  _______________________________________________________  If you are age 48 or older, your body mass index should be between 23-30. Your Body mass index is 34.33 kg/m. If this is out of the aforementioned range listed, please consider follow up with your Primary Care Provider.  If you are age 79 or younger, your body mass index should be between 19-25. Your Body mass index is 34.33 kg/m. If this is out of the aformentioned range listed, please consider follow up with your Primary Care Provider.   ________________________________________________________  The [place] [place] providers would like to encourage you to use [facility] to communicate with providers for non-urgent requests or questions.  Due to long hold times on the telephone, sending your provider a message by [facility] may be a faster and more efficient way to get a response.  Please allow 48 business hours for a response.  Please remember that this is for non-urgent requests.  _______________________________________________________  Makayla Kelly have been scheduled for an endoscopy and colonoscopy. Please follow the written instructions given to you at your visit today. Please pick up your prep supplies at the pharmacy within the next 1-3 days. If you use inhalers (even only as needed), please bring them with you on the day of your procedure.  We have sent the following medications to your pharmacy for you to pick up at your convenience: Clenpiq  Please call with any questions or concerns.  It was a pleasure to see you today!  Vito Cirigliano, D.O.

## 2022-07-10 NOTE — Progress Notes (Signed)
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  She reports a longstanding history of GERD with a index symptoms of heartburn and regurgitation.  Generally well-controlled with omeprazole 20 mg daily. Worse with any missed dose. Worse with supine, fatty fatty foods, and some dessert foods.  No dysphagia.  No previous EGD.  No previous CRC screening.  No lower GI symptoms.  No known family history of CRC, GI malignancy, liver disease, pancreatic disease, or IBD.     Past Medical History:  Diagnosis Date   GERD (gastroesophageal reflux disease)    Hypertension      Past Surgical History:  Procedure Laterality Date   CHOLECYSTECTOMY  2000   2000   Family History  Problem Relation Age of Onset   Early death Mother    Ovarian cancer Mother    Hypertension Father    Heart disease Father    Diabetes Father    Arthritis Father    Depression Brother    Stomach cancer Maternal Grandmother    Heart disease Maternal Grandfather    Heart attack Maternal Grandfather    Heart attack Paternal Grandmother    Diabetes Paternal Grandmother    Heart disease Paternal Grandmother    Stroke Paternal Grandfather    Heart disease Paternal Grandfather    Diabetes Paternal Grandfather    Liver disease Neg Hx    Esophageal cancer Neg Hx    Colon cancer Neg Hx    Social History   Tobacco Use   Smoking status: Never    Passive exposure: Never   Smokeless tobacco: Never  Vaping Use   Vaping Use: Never used  Substance Use Topics   Alcohol use: Not Currently   Drug use: Never   Current Outpatient Medications  Medication Sig Dispense Refill   fluticasone (FLONASE) 50 MCG/ACT nasal spray Place into both nostrils.     omeprazole (PRILOSEC) 20 MG capsule Take 1 capsule (20 mg total) by  mouth daily. 90 capsule 1   triamterene-hydrochlorothiazide (MAXZIDE-25) 37.5-25 MG tablet Take 1 tablet by mouth daily. 90 tablet 1   No current facility-administered medications for this visit.   Allergies  Allergen Reactions   Erythromycin Other (See Comments) and Nausea And Vomiting     Review of Systems: All systems reviewed and negative except where noted in HPI.     Physical Exam:    Wt Readings from Last 3 Encounters:  07/10/22 200 lb (90.7 kg)  04/02/22 205 lb 6.4 oz (93.2 kg)  08/02/21 192 lb 9.6 oz (87.4 kg)    BP 122/80   Pulse 66   Ht 5\' 4"  (1.626 m)   Wt 200 lb (90.7 kg)   BMI 34.33 kg/m  Constitutional:  Pleasant, in no acute distress. Psychiatric: Normal mood and affect. Behavior is normal. Neurological: Alert and oriented to person place and time. Skin: Skin is warm and dry. No rashes noted.   ASSESSMENT AND PLAN;   1) GERD Longstanding history of reflux, generally well-controlled with omeprazole 20 mg daily.  Discussed pathophysiology of reflux today.  Discussed the role/utility of upper endoscopy, mainly from a Barrett's Esophagus screening standpoint, and she would very much like to proceed with that route. -  EGD for Barrett's Esophagus screening along with evaluation for erosive esophagitis, LES laxity, hiatal hernia given duration of reflux symptoms and other RFs - Continue omeprazole - Continue antireflux lifestyle/dietary modifications  2) Colon cancer screening - Due for age-appropriate, average risk CRC screening - Schedule colonoscopy - Per patient preference, will schedule procedures to be done at the end of June (husband is a Chartered loss adjuster)  The indications, risks, and benefits of EGD and colonoscopy were explained to the patient in detail. Risks include but are not limited to bleeding, perforation, adverse reaction to medications, and cardiopulmonary compromise. Sequelae include but are not limited to the possibility of surgery,  hospitalization, and mortality. The patient verbalized understanding and wished to proceed. All questions answered, referred to scheduler and bowel prep ordered. Further recommendations pending results of the exam.     Shellia Cleverly, DO, FACG  07/10/2022, 3:34 PM   Kuneff, Renee A, DO

## 2022-08-22 ENCOUNTER — Encounter: Payer: Self-pay | Admitting: Gastroenterology

## 2022-08-22 ENCOUNTER — Ambulatory Visit: Payer: BC Managed Care – PPO | Admitting: Gastroenterology

## 2022-08-22 VITALS — BP 94/58 | HR 65 | Temp 97.5°F | Resp 12 | Ht 64.0 in | Wt 200.0 lb

## 2022-08-22 DIAGNOSIS — D125 Benign neoplasm of sigmoid colon: Secondary | ICD-10-CM

## 2022-08-22 DIAGNOSIS — K319 Disease of stomach and duodenum, unspecified: Secondary | ICD-10-CM | POA: Diagnosis not present

## 2022-08-22 DIAGNOSIS — K640 First degree hemorrhoids: Secondary | ICD-10-CM

## 2022-08-22 DIAGNOSIS — K317 Polyp of stomach and duodenum: Secondary | ICD-10-CM

## 2022-08-22 DIAGNOSIS — Z1211 Encounter for screening for malignant neoplasm of colon: Secondary | ICD-10-CM

## 2022-08-22 DIAGNOSIS — K219 Gastro-esophageal reflux disease without esophagitis: Secondary | ICD-10-CM

## 2022-08-22 DIAGNOSIS — K2970 Gastritis, unspecified, without bleeding: Secondary | ICD-10-CM

## 2022-08-22 DIAGNOSIS — K5730 Diverticulosis of large intestine without perforation or abscess without bleeding: Secondary | ICD-10-CM

## 2022-08-22 MED ORDER — SODIUM CHLORIDE 0.9 % IV SOLN
500.0000 mL | Freq: Once | INTRAVENOUS | Status: DC
Start: 2022-08-22 — End: 2022-08-22

## 2022-08-22 NOTE — Op Note (Signed)
[facility] Patient Name: Makayla Kelly Procedure Date: 08/22/2022 1:22 PM MRN: [id number] Endoscopist: Doristine Locks , MD, [id number] Age: 47 Referring MD:  Date of Birth: [birth year] Gender: Female Account #: [id number] Procedure:                Colonoscopy Indications:              Screening for colorectal malignant neoplasm, This                            is the patient's first colonoscopy Medicines:                Monitored Anesthesia Care Procedure:                Pre-Anesthesia Assessment:                           - Prior to the procedure, a History and Physical                            was performed, and patient medications and                            allergies were reviewed. The patient's tolerance of                            previous anesthesia was also reviewed. The risks                            and benefits of the procedure and the sedation                            options and risks were discussed with the patient.                            All questions were answered, and informed consent                            was obtained. Prior Anticoagulants: The patient has                            taken no anticoagulant or antiplatelet agents. ASA                            Grade Assessment: II - A patient with mild systemic                            disease. After reviewing the risks and benefits,                            the patient was deemed in satisfactory condition to                            undergo the procedure.  After obtaining informed consent, the colonoscope                            was passed under direct vision. Throughout the                            procedure, the patient's blood pressure, pulse, and                            oxygen saturations were monitored continuously. The                            CF [id number] #[id number] was introduced through the anus                            and advanced to the  the terminal ileum. The                            colonoscopy was performed without difficulty. The                            patient tolerated the procedure well. The quality                            of the bowel preparation was excellent. The                            terminal ileum, ileocecal valve, appendiceal                            orifice, and rectum were photographed. Scope In: 1:36:46 PM Scope Out: 1:49:00 PM Scope Withdrawal Time: 0 hours 9 minutes 5 seconds  Total Procedure Duration: 0 hours 12 minutes 14 seconds  Findings:                 The perianal and digital rectal examinations were                            normal.                           A 3 mm polyp was found in the sigmoid colon. The                            polyp was sessile. The polyp was removed with a                            cold snare. Resection and retrieval were complete.                            Estimated blood loss was minimal.                           A few small-mouthed diverticula were found in the  sigmoid colon.                           Non-bleeding internal hemorrhoids were found during                            retroflexion. The hemorrhoids were small and Grade                            I (internal hemorrhoids that do not prolapse).                           The exam was otherwise normal throughout the                            remainder of the colon.                           The terminal ileum appeared normal. Complications:            No immediate complications. Estimated Blood Loss:     Estimated blood loss was minimal. Impression:               - One 3 mm polyp in the sigmoid colon, removed with                            a cold snare. Resected and retrieved.                           - Diverticulosis in the sigmoid colon.                           - Non-bleeding internal hemorrhoids.                           - The examined portion of the ileum  was normal. Recommendation:           - Patient has a contact number available for                            emergencies. The signs and symptoms of potential                            delayed complications were discussed with the                            patient. Return to normal activities tomorrow.                            Written discharge instructions were provided to the                            patient.                           - Resume previous diet.                           -  Continue present medications.                           - Await pathology results.                           - Repeat colonoscopy in 5-10 years for surveillance                            based on pathology results.                           - Return to GI office PRN. Doristine Locks, MD 08/22/2022 2:12:15 PM

## 2022-08-22 NOTE — Progress Notes (Signed)
Called to room to assist during endoscopic procedure.  Patient ID and intended procedure confirmed with present staff. Received instructions for my participation in the procedure from the performing physician.  

## 2022-08-22 NOTE — Progress Notes (Signed)
Pt's states no medical or surgical changes since previsit or office visit. 

## 2022-08-22 NOTE — Op Note (Signed)
Makayla Kelly Patient Name: Makayla Kelly Procedure Date: 08/22/2022 1:22 PM MRN: [id number] Endoscopist: Doristine Locks , MD, [id number] Age: 47 Referring MD:  Date of Birth: 1975-05-06 Gender: Female Account #: [id number] Procedure:                Upper GI endoscopy Indications:              Heartburn, Suspected esophageal reflux Medicines:                Monitored Anesthesia Care Procedure:                Pre-Anesthesia Assessment:                           - Prior to the procedure, a History and Physical                            was performed, and patient medications and                            allergies were reviewed. The patient's tolerance of                            previous anesthesia was also reviewed. The risks                            and benefits of the procedure and the sedation                            options and risks were discussed with the patient.                            All questions were answered, and informed consent                            was obtained. Prior Anticoagulants: The patient has                            taken no anticoagulant or antiplatelet agents. ASA                            Grade Assessment: II - A patient with mild systemic                            disease. After reviewing the risks and benefits,                            the patient was deemed in satisfactory condition to                            undergo the procedure.                           After obtaining informed consent, the endoscope was  passed under direct vision. Throughout the                            procedure, the patient's blood pressure, pulse, and                            oxygen saturations were monitored continuously. The                            GIF [id number] #[id number] was introduced through the                            mouth, and advanced to the third part of duodenum.                            The upper GI  endoscopy was accomplished without                            difficulty. The patient tolerated the procedure                            well. Scope In: Scope Out: Findings:                 The examined esophagus was normal.                           The Z-line was regular and was found 36 cm from the                            incisors.                           The gastroesophageal flap valve was visualized                            endoscopically and classified as Hill Grade III                            (minimal fold, loose to endoscope, hiatal hernia                            likely).                           Localized mild inflammation characterized by                            erythema was found in the gastric antrum. Biopsies                            were taken with a cold forceps for Helicobacter                            pylori testing and histology. Estimated blood loss  was minimal.                           Multiple 3 to 6 mm sessile polyps with no bleeding                            were found in the gastric fundus and in the gastric                            body. Several of these polyps were removed with a                            cold biopsy forceps. Resection and retrieval were                            complete. Estimated blood loss was minimal.                           The examined duodenum was normal. Complications:            No immediate complications. Estimated Blood Loss:     Estimated blood loss was minimal. Impression:               - Normal esophagus.                           - Z-line regular, 36 cm from the incisors.                           - Gastroesophageal flap valve classified as Hill                            Grade III (minimal fold, loose to endoscope, hiatal                            hernia likely).                           - Gastritis. Biopsied.                           - Multiple gastric polyps.  Resected and retrieved.                           - Normal examined duodenum. Recommendation:           - Patient has a contact number available for                            emergencies. The signs and symptoms of potential                            delayed complications were discussed with the                            patient. Return to normal activities tomorrow.  Written discharge instructions were provided to the                            patient.                           - Resume previous diet.                           - Continue present medications.                           - Await pathology results.                           - Perform a colonoscopy today. Doristine Locks, MD 08/22/2022 2:07:29 PM

## 2022-08-22 NOTE — Patient Instructions (Signed)
-  Handout on gastritis, polyps provided -await pathology results -repeat colonoscopy for surveillance recommended. Date to be determined when pathology result become available   -Continue present medications   YOU HAD AN ENDOSCOPIC PROCEDURE TODAY AT THE [facility]:   Refer to the procedure report that was given to you for any specific questions about what was found during the examination.  If the procedure report does not answer your questions, please call your gastroenterologist to clarify.  If you requested that your care partner not be given the details of your procedure findings, then the procedure report has been included in a sealed envelope for you to review at your convenience later.  YOU SHOULD EXPECT: Some feelings of bloating in the abdomen. Passage of more gas than usual.  Walking can help get rid of the air that was put into your GI tract during the procedure and reduce the bloating. If you had a lower endoscopy (such as a colonoscopy or flexible sigmoidoscopy) you may notice spotting of blood in your stool or on the toilet paper. If you underwent a bowel prep for your procedure, you may not have a normal bowel movement for a few days.  Please Note:  You might notice some irritation and congestion in your nose or some drainage.  This is from the oxygen used during your procedure.  There is no need for concern and it should clear up in a day or so.  SYMPTOMS TO REPORT IMMEDIATELY:  Following lower endoscopy (colonoscopy or flexible sigmoidoscopy):  Excessive amounts of blood in the stool  Significant tenderness or worsening of abdominal pains  Swelling of the abdomen that is new, acute  Fever of 100F or higher  Following upper endoscopy (EGD)  Vomiting of blood or coffee ground material  New chest pain or pain under the shoulder blades  Painful or persistently difficult swallowing  New shortness of breath  Fever of 100F or higher  Black, tarry-looking  stools  For urgent or emergent issues, a gastroenterologist can be reached at any hour by calling [phone]. Do not use MyChart messaging for urgent concerns.    DIET:  We do recommend a small meal at first, but then you may proceed to your regular diet.  Drink plenty of fluids but you should avoid alcoholic beverages for 24 hours.  ACTIVITY:  You should plan to take it easy for the rest of today and you should NOT DRIVE or use heavy machinery until tomorrow (because of the sedation medicines used during the test).    FOLLOW UP: Our staff will call the number listed on your records the next business day following your procedure.  We will call around 7:15- 8:00 am to check on you and address any questions or concerns that you may have regarding the information given to you following your procedure. If we do not reach you, we will leave a message.     If any biopsies were taken you will be contacted by phone or by letter within the next 1-3 weeks.  Please call us at [phone] if you have not heard about the biopsies in 3 weeks.    SIGNATURES/CONFIDENTIALITY: You and/or your care partner have signed paperwork which will be entered into your electronic medical record.  These signatures attest to the fact that that the information above on your After Visit Summary has been reviewed and is understood.  Full responsibility of the confidentiality of this discharge information lies with you and/or your care-partner.

## 2022-08-22 NOTE — Progress Notes (Signed)
A/O x 3, gd SR's, VSS, report to RN 

## 2022-08-22 NOTE — Progress Notes (Signed)
GASTROENTEROLOGY PROCEDURE H&P NOTE   Primary Care Physician: Natalia Leatherwood, DO    Reason for Procedure:  GERD, Colon Cancer screening  Plan:    EGD, colonoscopy  Patient is appropriate for endoscopic procedure(s) in the ambulatory ([facility]) setting.  The nature of the procedure, as well as the risks, benefits, and alternatives were carefully and thoroughly reviewed with the patient. Ample time for discussion and questions allowed. The patient understood, was satisfied, and agreed to proceed.     HPI: Makayla Kelly is a 47 y.o. female who presents for EGD for evaluation of GERD and Barrett's Esophagus screening, along with colonoscopy for routine Colon Cancer screening.  No known family history of colon cancer or related malignancy.  Patient is otherwise without complaints or active issues today.  Past Medical History:  Diagnosis Date   GERD (gastroesophageal reflux disease)    Hypertension     Past Surgical History:  Procedure Laterality Date   CHOLECYSTECTOMY  2000   2000    Prior to Admission medications   Medication Sig Start Date End Date Taking? Authorizing Provider  fluticasone (FLONASE) 50 MCG/ACT nasal spray Place into both nostrils. 03/06/22  Yes [provider]  omeprazole (PRILOSEC) 20 MG capsule Take 1 capsule (20 mg total) by mouth daily. 04/02/22  Yes Kuneff, Renee A, DO  triamterene-hydrochlorothiazide (MAXZIDE-25) 37.5-25 MG tablet Take 1 tablet by mouth daily. 04/02/22 05/02/22  Felix Pacini A, DO    Current Outpatient Medications  Medication Sig Dispense Refill   fluticasone (FLONASE) 50 MCG/ACT nasal spray Place into both nostrils.     omeprazole (PRILOSEC) 20 MG capsule Take 1 capsule (20 mg total) by mouth daily. 90 capsule 1   triamterene-hydrochlorothiazide (MAXZIDE-25) 37.5-25 MG tablet Take 1 tablet by mouth daily. 90 tablet 1   Current Facility-Administered Medications  Medication Dose Route Frequency Provider Last Rate Last Admin   0.9  %  sodium chloride infusion  500 mL Intravenous Once Catherin Doorn V, DO        Allergies as of 08/22/2022 - Review Complete 08/22/2022  Allergen Reaction Noted   Erythromycin Other (See Comments) and Nausea And Vomiting 06/23/2013    Family History  Problem Relation Age of Onset   Early death Mother    Ovarian cancer Mother    Hypertension Father    Heart disease Father    Diabetes Father    Arthritis Father    Depression Brother    Stomach cancer Maternal Grandmother    Heart disease Maternal Grandfather    Heart attack Maternal Grandfather    Heart attack Paternal Grandmother    Diabetes Paternal Grandmother    Heart disease Paternal Grandmother    Stroke Paternal Grandfather    Heart disease Paternal Grandfather    Diabetes Paternal Grandfather    Liver disease Neg Hx    Esophageal cancer Neg Hx    Colon cancer Neg Hx     Social History   Socioeconomic History   Marital status: Married    Spouse name: Not on file   Number of children: 2   Years of education: Not on file   Highest education level: Not on file  Occupational History   Not on file  Tobacco Use   Smoking status: Never    Passive exposure: Never   Smokeless tobacco: Never  Vaping Use   Vaping Use: Never used  Substance and Sexual Activity   Alcohol use: Not Currently   Drug use: Never   Sexual  activity: Yes    Partners: Male    Comment: married  Other Topics Concern   Not on file  Social History Narrative   Marital status/children/pets: Married, 2 children   Education/employment: Masters degree, works as a Water engineer:      -Wears a bicycle helmet riding a bike: Yes     -smoke alarm in the home:Yes     - wears seatbelt: Yes     - Feels safe in their relationships: Yes      Social Determinants of Corporate investment banker Strain: Not on file  Food Insecurity: Not on file  Transportation Needs: Not on file  Physical Activity: Not on file  Stress: Not on file  Social  Connections: Not on file  Intimate Partner Violence: Not on file    Physical Exam: Vital signs in last 24 hours: @BP  121/78   Pulse 80   Temp (!) 97.5 F (36.4 C)   Ht 5\' 4"  (1.626 m)   Wt 200 lb (90.7 kg)   SpO2 98%   BMI 34.33 kg/m  GEN: NAD EYE: Sclerae anicteric ENT: MMM CV: Non-tachycardic Pulm: CTA b/l GI: Soft, NT/ND NEURO:  Alert & Oriented x 3   Doristine Locks, DO [facility]   08/22/2022 1:05 PM

## 2022-08-23 ENCOUNTER — Telehealth: Payer: Self-pay

## 2022-08-23 NOTE — Telephone Encounter (Signed)
  Follow up Call-     08/22/2022   12:40 PM  Call back number  Post procedure Call Back phone  # [phone]  Permission to leave phone message Yes    Post op call attempted, no answer, left WM.

## 2022-10-05 ENCOUNTER — Ambulatory Visit: Payer: Self-pay | Admitting: Family Medicine

## 2022-12-05 ENCOUNTER — Other Ambulatory Visit: Payer: Self-pay | Admitting: Family Medicine

## 2023-02-04 ENCOUNTER — Ambulatory Visit: Payer: BC Managed Care – PPO | Admitting: Family Medicine

## 2023-02-04 ENCOUNTER — Encounter: Payer: Self-pay | Admitting: Family Medicine

## 2023-02-04 VITALS — BP 110/82 | HR 79 | Temp 98.9°F | Wt 204.0 lb

## 2023-02-04 DIAGNOSIS — H43812 Vitreous degeneration, left eye: Secondary | ICD-10-CM | POA: Insufficient documentation

## 2023-02-04 DIAGNOSIS — E876 Hypokalemia: Secondary | ICD-10-CM | POA: Diagnosis not present

## 2023-02-04 DIAGNOSIS — K219 Gastro-esophageal reflux disease without esophagitis: Secondary | ICD-10-CM | POA: Diagnosis not present

## 2023-02-04 DIAGNOSIS — I10 Essential (primary) hypertension: Secondary | ICD-10-CM | POA: Diagnosis not present

## 2023-02-04 DIAGNOSIS — Z8249 Family history of ischemic heart disease and other diseases of the circulatory system: Secondary | ICD-10-CM | POA: Diagnosis not present

## 2023-02-04 DIAGNOSIS — E669 Obesity, unspecified: Secondary | ICD-10-CM | POA: Insufficient documentation

## 2023-02-04 DIAGNOSIS — Z823 Family history of stroke: Secondary | ICD-10-CM

## 2023-02-04 MED ORDER — TRIAMTERENE-HCTZ 37.5-25 MG PO TABS: 1.0000 | ORAL_TABLET | Freq: Every day | ORAL | 1 refills | Status: DC

## 2023-02-04 MED ORDER — OMEPRAZOLE 20 MG PO CPDR: 20.0000 mg | DELAYED_RELEASE_CAPSULE | Freq: Every day | ORAL | 1 refills | Status: DC

## 2023-02-04 NOTE — Patient Instructions (Addendum)
Has appt in Feb 2025        Great to see you today.  I have refilled the medication(s) we provide.   If labs were collected or images ordered, we will inform you of  results once we have received them and reviewed. We will contact you either by echart message, or telephone call.  Please give ample time to the testing facility, and our office to run,  receive and review results. Please do not call inquiring of results, even if you can see them in your chart. We will contact you as soon as we are able. If it has been over 1 week since the test was completed, and you have not yet heard from [place], then please call us.    - echart message- for normal results that have been seen by the patient already.   - telephone call: abnormal results or if patient has not viewed results in their echart.  If a referral to a specialist was entered for you, please call us in 2 weeks if you have not heard from the specialist office to schedule.

## 2023-02-04 NOTE — Progress Notes (Signed)
Patient ID: Makayla Kelly, female  DOB: [birth year], 47 y.o.   MRN: [id number] Patient Care Team    Relationship Specialty Notifications Start End  Natalia Leatherwood, DO PCP - General Family Medicine  07/11/22   Tressia Danas, MD (Inactive) Consulting Physician Gastroenterology  03/30/22   Hollar, Ronal Fear, MD Referring Physician Dermatology  04/02/22   Ablott, [facility]  04/02/22   Ricka Burdock, MD  Obstetrics and Gynecology  04/02/22     Chief Complaint  Patient presents with   Hypertension    Subjective: Makayla Kelly is a 47 y.o.  female present for chronic condition management All past medical history, surgical history, allergies, family history, immunizations, medications and social history were updated in the electronic medical record today. All recent labs, ED visits and hospitalizations within the last year were reviewed.  HTN:Pt reports compliance with maxzide. Blood pressures ranges at home normal.  Patient denies chest pain, shortness of breath, dizziness or lower extremity edema.  Pt doesnot daily baby ASA. Pt is not prescribed statin. RF: HTN, FH heart disease, FH stroke Last BMP- hypokalemia 3.1  GERD: pt reports compliance with omperazole 20 mg every day.      04/02/2022    3:47 PM  Depression screen PHQ 2/9  Decreased Interest 0  Down, Depressed, Hopeless 0  PHQ - 2 Score 0       No data to display                04/02/2022    3:47 PM  Fall Risk   Falls in the past year? 0  Number falls in past yr: 0  Injury with Fall? 0  Follow up Falls evaluation completed    Immunization History  Administered Date(s) Administered   Influenza Split 11/27/2014   Influenza,inj,Quad PF,6+ Mos 12/20/2021   Influenza-Unspecified 11/28/2017, 11/18/2018, 11/27/2022   Tdap 12/29/2018    No results found.  Past Medical History:  Diagnosis Date   GERD (gastroesophageal reflux disease)    Hypertension    Allergies  Allergen Reactions    Erythromycin Other (See Comments) and Nausea And Vomiting   Past Surgical History:  Procedure Laterality Date   CHOLECYSTECTOMY  2000   2000   Family History  Problem Relation Age of Onset   Early death Mother    Ovarian cancer Mother    Hypertension Father    Heart disease Father    Diabetes Father    Arthritis Father    Depression Brother    Stomach cancer Maternal Grandmother    Heart disease Maternal Grandfather    Heart attack Maternal Grandfather    Heart attack Paternal Grandmother    Diabetes Paternal Grandmother    Heart disease Paternal Grandmother    Stroke Paternal Grandfather    Heart disease Paternal Grandfather    Diabetes Paternal Grandfather    Liver disease Neg Hx    Esophageal cancer Neg Hx    Colon cancer Neg Hx    Social History   Social History Narrative   Marital status/children/pets: Married, 2 children   Education/employment: Masters degree, works as a Water engineer:      -Wears a bicycle helmet riding a bike: Yes     -smoke alarm in the home:Yes     - wears seatbelt: Yes     - Feels safe in their relationships: Yes       Allergies as of 02/04/2023       Reactions  Erythromycin Other (See Comments), Nausea And Vomiting        Medication List        Accurate as of February 04, 2023  2:58 PM. If you have any questions, ask your nurse or doctor.          STOP taking these medications    fluticasone 50 MCG/ACT nasal spray Commonly known as: FLONASE Stopped by: Felix Pacini       TAKE these medications    omeprazole 20 MG capsule Commonly known as: PRILOSEC Take 1 capsule (20 mg total) by mouth daily.   triamterene-hydrochlorothiazide 37.5-25 MG tablet Commonly known as: MAXZIDE-25 Take 1 tablet by mouth daily.        All past medical history, surgical history, allergies, family history, immunizations andmedications were updated in the EMR today and reviewed under the history and medication portions of their EMR.     No results found for this or any previous visit (from the past 2160 hour(s)).    ROS 14 pt review of systems performed and negative (unless mentioned in an HPI)  Objective: BP 110/82   Pulse 79   Temp 98.9 F (37.2 C)   Wt 204 lb (92.5 kg)   SpO2 98%   BMI 35.02 kg/m  Physical Exam Vitals and nursing note reviewed.  Constitutional:      General: She is not in acute distress.    Appearance: Normal appearance. She is not ill-appearing, toxic-appearing or diaphoretic.  HENT:     Head: Normocephalic and atraumatic.  Eyes:     General: No scleral icterus.       Right eye: No discharge.        Left eye: No discharge.     Extraocular Movements: Extraocular movements intact.     Conjunctiva/sclera: Conjunctivae normal.     Pupils: Pupils are equal, round, and reactive to light.  Cardiovascular:     Rate and Rhythm: Normal rate and regular rhythm.     Heart sounds: No murmur heard. Pulmonary:     Effort: Pulmonary effort is normal. No respiratory distress.     Breath sounds: Normal breath sounds. No wheezing, rhonchi or rales.  Musculoskeletal:     Right lower leg: No edema.     Left lower leg: No edema.  Skin:    General: Skin is warm.     Findings: No rash.  Neurological:     Mental Status: She is alert and oriented to person, place, and time. Mental status is at baseline.     Motor: No weakness.     Gait: Gait normal.  Psychiatric:        Mood and Affect: Mood normal.        Behavior: Behavior normal.        Thought Content: Thought content normal.        Judgment: Judgment normal.     Assessment/plan: Makayla Kelly is a 47 y.o. female present for est care-[facility] Establishing care with new doctor, encounter for Primary hypertension/ FH: heart disease/ FH: stroke/hypokalemia/ Stable Continue maxide  - CT CARDIAC SCORING: Score of 0, 04/10/2022 Encourage heart healthy diet and routine exercise. Pt desired: cbc, cmp, tsh, lipids and A1c (she understands A1c may not  be covered by insurance)  Gastroesophageal reflux disease without esophagitis Stable Continue omperazole 20 mg qd prn Encouraged B12 subL (last labs <400)  Vitreous detachment of left eye Working with ophthalmology - Hemoglobin A1c   Return for Has appt in Feb 2025.  Orders Placed This Encounter  Procedures   CBC   Comp Met (CMET)   TSH   Lipid panel   Hemoglobin A1c   Meds ordered this encounter  Medications   omeprazole (PRILOSEC) 20 MG capsule    Sig: Take 1 capsule (20 mg total) by mouth daily.    Dispense:  90 capsule    Refill:  1   triamterene-hydrochlorothiazide (MAXZIDE-25) 37.5-25 MG tablet    Sig: Take 1 tablet by mouth daily.    Dispense:  90 tablet    Refill:  1   Referral Orders  No referral(s) requested today     Note is dictated utilizing voice recognition software. Although note has been proof read prior to signing, occasional typographical errors still can be missed. If any questions arise, please do not hesitate to call for verification.  Electronically signed by: Felix Pacini, DO [facility]

## 2023-02-05 LAB — CBC
HCT: 44.2 % (ref 36.0–46.0)
Hemoglobin: 14.4 g/dL (ref 12.0–15.0)
MCHC: 32.6 g/dL (ref 30.0–36.0)
MCV: 85 fL (ref 78.0–100.0)
Platelets: 178 10*3/uL (ref 150.0–400.0)
RBC: 5.2 Mil/uL — ABNORMAL HIGH (ref 3.87–5.11)
RDW: 14.2 % (ref 11.5–15.5)
WBC: 5 10*3/uL (ref 4.0–10.5)

## 2023-02-05 LAB — COMPREHENSIVE METABOLIC PANEL
ALT: 19 U/L (ref 0–35)
AST: 26 U/L (ref 0–37)
Albumin: 4.8 g/dL (ref 3.5–5.2)
Alkaline Phosphatase: 66 U/L (ref 39–117)
BUN: 13 mg/dL (ref 6–23)
CO2: 31 meq/L (ref 19–32)
Calcium: 9.9 mg/dL (ref 8.4–10.5)
Chloride: 98 meq/L (ref 96–112)
Creatinine, Ser: 0.95 mg/dL (ref 0.40–1.20)
GFR: 70.7 mL/min (ref >60.00)
Glucose, Bld: 77 mg/dL (ref 70–99)
Potassium: 3.9 meq/L (ref 3.5–5.1)
Sodium: 138 meq/L (ref 135–145)
Total Bilirubin: 0.5 mg/dL (ref 0.2–1.2)
Total Protein: 7.7 g/dL (ref 6.0–8.3)

## 2023-02-05 LAB — LIPID PANEL
Cholesterol: 228 mg/dL — ABNORMAL HIGH (ref 0–200)
HDL: 60.4 mg/dL (ref >39.00)
LDL Cholesterol: 151 mg/dL — ABNORMAL HIGH (ref 0–99)
NonHDL: 167.33
Total CHOL/HDL Ratio: 4
Triglycerides: 84 mg/dL (ref 0.0–149.0)
VLDL: 16.8 mg/dL (ref 0.0–40.0)

## 2023-02-05 LAB — TSH: TSH: 1.19 u[IU]/mL (ref 0.35–5.50)

## 2023-02-05 LAB — HEMOGLOBIN A1C: Hgb A1c MFr Bld: 5.9 % (ref 4.6–6.5)

## 2023-03-29 ENCOUNTER — Telehealth (INDEPENDENT_AMBULATORY_CARE_PROVIDER_SITE_OTHER): Payer: Self-pay | Admitting: Family Medicine

## 2023-03-29 ENCOUNTER — Encounter: Payer: Self-pay | Admitting: Family Medicine

## 2023-03-29 VITALS — Temp 102.0°F | Wt 200.0 lb

## 2023-03-29 DIAGNOSIS — Z20828 Contact with and (suspected) exposure to other viral communicable diseases: Secondary | ICD-10-CM

## 2023-03-29 DIAGNOSIS — R6889 Other general symptoms and signs: Secondary | ICD-10-CM

## 2023-03-29 DIAGNOSIS — J029 Acute pharyngitis, unspecified: Secondary | ICD-10-CM

## 2023-03-29 DIAGNOSIS — R509 Fever, unspecified: Secondary | ICD-10-CM

## 2023-03-29 MED ORDER — ONDANSETRON HCL 4 MG PO TABS: 4.0000 mg | ORAL_TABLET | Freq: Three times a day (TID) | ORAL | 0 refills | Status: DC | PRN

## 2023-03-29 MED ORDER — OSELTAMIVIR PHOSPHATE 75 MG PO CAPS: 75.0000 mg | ORAL_CAPSULE | Freq: Two times a day (BID) | ORAL | 0 refills | Status: AC

## 2023-03-29 NOTE — Progress Notes (Signed)
VIRTUAL VISIT VIA VIDEO  I connected with Makayla Kelly on 03/29/23 at  1:00 PM EST by a video enabled telemedicine application and verified that I am speaking with the correct person using two identifiers. Location patient: Home Location provider: [facility], Office Persons participating in the virtual visit: Patient, Dr. Claiborne Billings and Ivonne Andrew, CMA  I discussed the limitations of evaluation and management by telemedicine and the availability of in person appointments. The patient expressed understanding and agreed to proceed.     AZZURE Kelly , [birth year], 48 y.o., female MRN: [id number] Patient Care Team    Relationship Specialty Notifications Start End  Natalia Leatherwood, DO PCP - General Family Medicine  07/11/22   Tressia Danas, MD (Inactive) Consulting Physician Gastroenterology  03/30/22   Hollar, Ronal Fear, MD Referring Physician Dermatology  04/02/22   Ablott, [facility]  04/02/22   Ricka Burdock, MD  Obstetrics and Gynecology  04/02/22     Chief Complaint  Patient presents with   Sore Throat    Expsoure to Flu A last week; fever; congestion started yesterday     Subjective: Makayla Kelly is a 48 y.o. Pt presents for an OV with complaints of fever, nausea, sore throat of 2d duration.  Associated symptoms include nausea. Taking Alk plus cold and flu      04/02/2022    3:47 PM  Depression screen PHQ 2/9  Decreased Interest 0  Down, Depressed, Hopeless 0  PHQ - 2 Score 0    Allergies  Allergen Reactions   Erythromycin Other (See Comments) and Nausea And Vomiting   Social History   Social History Narrative   Marital status/children/pets: Married, 2 children   Education/employment: Masters degree, works as a Water engineer:      -Wears a bicycle helmet riding a bike: Yes     -smoke alarm in the home:Yes     - wears seatbelt: Yes     - Feels safe in their relationships: Yes      Past Medical History:  Diagnosis Date   GERD  (gastroesophageal reflux disease)    Hypertension    Past Surgical History:  Procedure Laterality Date   CHOLECYSTECTOMY  2000   2000   Family History  Problem Relation Age of Onset   Early death Mother    Ovarian cancer Mother    Hypertension Father    Heart disease Father    Diabetes Father    Arthritis Father    Depression Brother    Stomach cancer Maternal Grandmother    Heart disease Maternal Grandfather    Heart attack Maternal Grandfather    Heart attack Paternal Grandmother    Diabetes Paternal Grandmother    Heart disease Paternal Grandmother    Stroke Paternal Grandfather    Heart disease Paternal Grandfather    Diabetes Paternal Grandfather    Liver disease Neg Hx    Esophageal cancer Neg Hx    Colon cancer Neg Hx    Allergies as of 03/29/2023       Reactions   Erythromycin Other (See Comments), Nausea And Vomiting        Medication List        Accurate as of March 29, 2023  1:15 PM. If you have any questions, ask your nurse or doctor.          omeprazole 20 MG capsule Commonly known as: PRILOSEC Take 1 capsule (20 mg total) by  mouth daily.   ondansetron 4 MG tablet Commonly known as: ZOFRAN Take 1 tablet (4 mg total) by mouth every 8 (eight) hours as needed for nausea or vomiting. Started by: Felix Pacini   oseltamivir 75 MG capsule Commonly known as: Tamiflu Take 1 capsule (75 mg total) by mouth 2 (two) times daily for 5 days. Started by: Felix Pacini   triamterene-hydrochlorothiazide 37.5-25 MG tablet Commonly known as: MAXZIDE-25 Take 1 tablet by mouth daily.        All past medical history, surgical history, allergies, family history, immunizations andmedications were updated in the EMR today and reviewed under the history and medication portions of their EMR.     Review of Systems  Constitutional:  Positive for chills and fever.  HENT:  Positive for congestion and sore throat. Negative for ear discharge, ear pain and sinus  pain.   Eyes:  Negative for pain and discharge.  Respiratory:  Positive for cough. Negative for sputum production, shortness of breath and wheezing.   Gastrointestinal:  Positive for nausea. Negative for diarrhea and vomiting.  Musculoskeletal:  Positive for myalgias.  Skin:  Negative for rash.  Neurological:  Positive for headaches. Negative for dizziness.   Negative, with the exception of above mentioned in HPI   Objective:  Temp (!) 102 F (38.9 C)   Wt 200 lb (90.7 kg)   BMI 34.33 kg/m  Body mass index is 34.33 kg/m.  Physical Exam Vitals and nursing note reviewed.  Constitutional:      General: She is not in acute distress.    Appearance: Normal appearance. She is normal weight. She is not ill-appearing or toxic-appearing.  HENT:     Head: Normocephalic and atraumatic.  Eyes:     General: No scleral icterus.       Right eye: No discharge.        Left eye: No discharge.     Extraocular Movements: Extraocular movements intact.     Conjunctiva/sclera: Conjunctivae normal.     Pupils: Pupils are equal, round, and reactive to light.  Pulmonary:     Effort: Pulmonary effort is normal.  Musculoskeletal:     Cervical back: Normal range of motion.  Skin:    Findings: No rash.  Neurological:     Mental Status: She is alert and oriented to person, place, and time. Mental status is at baseline.     Motor: No weakness.     Coordination: Coordination normal.     Gait: Gait normal.  Psychiatric:        Mood and Affect: Mood normal.        Behavior: Behavior normal.        Thought Content: Thought content normal.        Judgment: Judgment normal.      No results found. No results found. No results found for this or any previous visit (from the past 24 hours).  Assessment/Plan: Makayla Kelly is a 48 y.o. female present for OV for  Sore throat/Fever, unspecified fever cause/flu-like sx/exposure to flu A Rest, hydrate.  OTC: mucinex (DM if cough), tamiflu prescribed,  take until completed.  Zofran for nausea If cough present it can last up to 6-8 weeks.  F/U 2 weeks if not improved.   Reviewed expectations re: course of current medical issues. Discussed self-management of symptoms. Outlined signs and symptoms indicating need for more acute intervention. Patient verbalized understanding and all questions were answered. Patient received an After-Visit Summary.    No orders  of the defined types were placed in this encounter.  Meds ordered this encounter  Medications   oseltamivir (TAMIFLU) 75 MG capsule    Sig: Take 1 capsule (75 mg total) by mouth 2 (two) times daily for 5 days.    Dispense:  10 capsule    Refill:  0   ondansetron (ZOFRAN) 4 MG tablet    Sig: Take 1 tablet (4 mg total) by mouth every 8 (eight) hours as needed for nausea or vomiting.    Dispense:  20 tablet    Refill:  0   Referral Orders  No referral(s) requested today     Note is dictated utilizing voice recognition software. Although note has been proof read prior to signing, occasional typographical errors still can be missed. If any questions arise, please do not hesitate to call for verification.   electronically signed by:  Felix Pacini, DO  [facility] [place]

## 2023-03-29 NOTE — Patient Instructions (Addendum)

## 2023-04-19 ENCOUNTER — Ambulatory Visit: Payer: 59 | Admitting: Family Medicine

## 2023-04-19 ENCOUNTER — Encounter: Payer: Self-pay | Admitting: Family Medicine

## 2023-04-19 VITALS — BP 112/80 | HR 82 | Temp 98.3°F | Ht 64.0 in | Wt 207.2 lb

## 2023-04-19 DIAGNOSIS — Z8249 Family history of ischemic heart disease and other diseases of the circulatory system: Secondary | ICD-10-CM

## 2023-04-19 DIAGNOSIS — Z79899 Other long term (current) drug therapy: Secondary | ICD-10-CM | POA: Diagnosis not present

## 2023-04-19 DIAGNOSIS — Z0000 Encounter for general adult medical examination without abnormal findings: Secondary | ICD-10-CM

## 2023-04-19 DIAGNOSIS — E876 Hypokalemia: Secondary | ICD-10-CM

## 2023-04-19 DIAGNOSIS — K219 Gastro-esophageal reflux disease without esophagitis: Secondary | ICD-10-CM

## 2023-04-19 DIAGNOSIS — E7800 Pure hypercholesterolemia, unspecified: Secondary | ICD-10-CM | POA: Insufficient documentation

## 2023-04-19 DIAGNOSIS — Z1231 Encounter for screening mammogram for malignant neoplasm of breast: Secondary | ICD-10-CM | POA: Diagnosis not present

## 2023-04-19 DIAGNOSIS — Z5181 Encounter for therapeutic drug level monitoring: Secondary | ICD-10-CM | POA: Diagnosis not present

## 2023-04-19 DIAGNOSIS — I10 Essential (primary) hypertension: Secondary | ICD-10-CM

## 2023-04-19 DIAGNOSIS — E669 Obesity, unspecified: Secondary | ICD-10-CM

## 2023-04-19 DIAGNOSIS — Z823 Family history of stroke: Secondary | ICD-10-CM

## 2023-04-19 LAB — LIPID PANEL
Cholesterol: 190 mg/dL (ref 0–200)
HDL: 54.3 mg/dL (ref >39.00)
LDL Cholesterol: 117 mg/dL — ABNORMAL HIGH (ref 0–99)
NonHDL: 135.48
Total CHOL/HDL Ratio: 3
Triglycerides: 91 mg/dL (ref 0.0–149.0)
VLDL: 18.2 mg/dL (ref 0.0–40.0)

## 2023-04-19 LAB — COMPREHENSIVE METABOLIC PANEL
ALT: 17 U/L (ref 0–35)
AST: 18 U/L (ref 0–37)
Albumin: 4.3 g/dL (ref 3.5–5.2)
Alkaline Phosphatase: 64 U/L (ref 39–117)
BUN: 12 mg/dL (ref 6–23)
CO2: 30 meq/L (ref 19–32)
Calcium: 9.3 mg/dL (ref 8.4–10.5)
Chloride: 103 meq/L (ref 96–112)
Creatinine, Ser: 0.78 mg/dL (ref 0.40–1.20)
GFR: 89.45 mL/min (ref >60.00)
Glucose, Bld: 84 mg/dL (ref 70–99)
Potassium: 4.2 meq/L (ref 3.5–5.1)
Sodium: 140 meq/L (ref 135–145)
Total Bilirubin: 0.5 mg/dL (ref 0.2–1.2)
Total Protein: 7.1 g/dL (ref 6.0–8.3)

## 2023-04-19 LAB — B12 AND FOLATE PANEL
Folate: 5.5 ng/mL — ABNORMAL LOW (ref >5.9)
Vitamin B-12: 261 pg/mL (ref 211–911)

## 2023-04-19 LAB — MAGNESIUM: Magnesium: 2 mg/dL (ref 1.5–2.5)

## 2023-04-19 LAB — VITAMIN D 25 HYDROXY (VIT D DEFICIENCY, FRACTURES): VITD: 23.75 ng/mL — ABNORMAL LOW (ref 30.00–100.00)

## 2023-04-19 NOTE — Progress Notes (Signed)
 Patient ID: Makayla Kelly, female  DOB: [birth year], 48 y.o.   MRN: [id number] Patient Care Team    Relationship Specialty Notifications Start End  Natalia Leatherwood, DO PCP - General Family Medicine  07/11/22   Tressia Danas, MD (Inactive) Consulting Physician Gastroenterology  03/30/22   Hollar, Ronal Fear, MD Referring Physician Dermatology  04/02/22   Karie Soda, [facility]  04/02/22   Ricka Burdock, MD  Obstetrics and Gynecology  04/02/22     Chief Complaint  Patient presents with   Annual Exam    Pt is fasting.     Subjective: Makayla Kelly is a 48 y.o.  female present for cpe and chronic condition management All past medical history, surgical history, allergies, family history, immunizations, medications and social history were updated in the electronic medical record today. All recent labs, ED visits and hospitalizations within the last year were reviewed.  Health maintenance:  Colonoscopy:08/22/2022- 7 follow up- cr. Cirgliano.  Mammogram:scheduled next week. - [facility] ([facility]) Cervical cancer screening: Completed 02/2018 2-5-year follow-up, gynecology Immunizations: Tdap UTD 2020, influenza UTD 2024, discussed Shingrix to start after 50 Infectious disease screening: hiv and hep c screening declined.  HTN:Pt reports compliance with maxzide. Blood pressures ranges at home normal.  She recently stopped taking Maxide after recent influenza illness and blood pressures have been within normal range.  She continues to monitor. Patient denies chest pain, shortness of breath, dizziness or lower extremity edema.  Pt doesnot daily baby ASA. Pt is not prescribed statin. RF: HTN, FH heart disease, FH stroke Last BMP- hypokalemia 3.1  GERD: pt reports compliance with omperazole 20 mg every day.      04/19/2023   10:43 AM 04/02/2022    3:47 PM  Depression screen PHQ 2/9  Decreased Interest 0 0  Down, Depressed, Hopeless 0 0  PHQ - 2 Score 0 0  Altered sleeping  0   Tired, decreased energy 0   Change in appetite 0   Feeling bad or failure about yourself  0   Trouble concentrating 0   Moving slowly or fidgety/restless 0   Suicidal thoughts 0   PHQ-9 Score 0   Difficult doing work/chores Not difficult at all       04/19/2023   10:43 AM  GAD 7 : Generalized Anxiety Score  Nervous, Anxious, on Edge 0  Control/stop worrying 0  Worry too much - different things 0  Trouble relaxing 0  Restless 0  Easily annoyed or irritable 0  Afraid - awful might happen 0  Total GAD 7 Score 0  Anxiety Difficulty Not difficult at all          04/19/2023   10:43 AM 04/02/2022    3:47 PM  Fall Risk   Falls in the past year? 0 0  Number falls in past yr:  0  Injury with Fall?  0  Follow up Falls evaluation completed Falls evaluation completed    Immunization History  Administered Date(s) Administered   Influenza Split 11/27/2014   Influenza,inj,Quad PF,6+ Mos 12/20/2021   Influenza-Unspecified 11/28/2017, 11/18/2018, 11/27/2022   Tdap 12/29/2018    No results found.  Past Medical History:  Diagnosis Date   GERD (gastroesophageal reflux disease)    Hypertension    Allergies  Allergen Reactions   Erythromycin Other (See Comments) and Nausea And Vomiting   Past Surgical History:  Procedure Laterality Date   CHOLECYSTECTOMY  2000   2000   Family History  Problem Relation Age of Onset   Early death Mother    Ovarian cancer Mother    Hypertension Father    Heart disease Father    Diabetes Father    Arthritis Father    Depression Brother    Stomach cancer Maternal Grandmother    Heart disease Maternal Grandfather    Heart attack Maternal Grandfather    Heart attack Paternal Grandmother    Diabetes Paternal Grandmother    Heart disease Paternal Grandmother    Stroke Paternal Grandfather    Heart disease Paternal Grandfather    Diabetes Paternal Grandfather    Liver disease Neg Hx    Esophageal cancer Neg Hx    Colon cancer Neg Hx     Social History   Social History Narrative   Marital status/children/pets: Married, 2 children   Education/employment: Masters degree, works as a Water engineer:      -Wears a bicycle helmet riding a bike: Yes     -smoke alarm in the home:Yes     - wears seatbelt: Yes     - Feels safe in their relationships: Yes       Allergies as of 04/19/2023       Reactions   Erythromycin Other (See Comments), Nausea And Vomiting        Medication List        Accurate as of April 19, 2023  3:41 PM. If you have any questions, ask your nurse or doctor.          STOP taking these medications    ondansetron 4 MG tablet Commonly known as: ZOFRAN Stopped by: Felix Pacini       TAKE these medications    omeprazole 20 MG capsule Commonly known as: PRILOSEC Take 1 capsule (20 mg total) by mouth daily.   triamterene-hydrochlorothiazide 37.5-25 MG tablet Commonly known as: MAXZIDE-25 Take 1 tablet by mouth daily.        All past medical history, surgical history, allergies, family history, immunizations andmedications were updated in the EMR today and reviewed under the history and medication portions of their EMR.    Recent Results (from the past 2160 hours)  CBC     Status: Abnormal   Collection Time: 02/04/23  2:53 PM  Result Value Ref Range   WBC 5.0 4.0 - 10.5 K/uL   RBC 5.20 (H) 3.87 - 5.11 Mil/uL   Platelets 178.0 150.0 - 400.0 K/uL   Hemoglobin 14.4 12.0 - 15.0 g/dL   HCT 40.9 81.1 - 91.4 %   MCV 85.0 78.0 - 100.0 fl   MCHC 32.6 30.0 - 36.0 g/dL   RDW 78.2 95.6 - 21.3 %  Comp Met (CMET)     Status: None   Collection Time: 02/04/23  2:53 PM  Result Value Ref Range   Sodium 138 135 - 145 mEq/L   Potassium 3.9 3.5 - 5.1 mEq/L   Chloride 98 96 - 112 mEq/L   CO2 31 19 - 32 mEq/L   Glucose, Bld 77 70 - 99 mg/dL   BUN 13 6 - 23 mg/dL   Creatinine, Ser 0.86 0.40 - 1.20 mg/dL   Total Bilirubin 0.5 0.2 - 1.2 mg/dL   Alkaline Phosphatase 66 39 - 117 U/L   AST 26  0 - 37 U/L   ALT 19 0 - 35 U/L   Total Protein 7.7 6.0 - 8.3 g/dL   Albumin 4.8 3.5 - 5.2 g/dL   GFR 57.84 >69.62 mL/min  Comment: Calculated using the CKD-EPI Creatinine Equation (2021)   Calcium 9.9 8.4 - 10.5 mg/dL  TSH     Status: None   Collection Time: 02/04/23  2:53 PM  Result Value Ref Range   TSH 1.19 0.35 - 5.50 uIU/mL  Lipid panel     Status: Abnormal   Collection Time: 02/04/23  2:53 PM  Result Value Ref Range   Cholesterol 228 (H) 0 - 200 mg/dL    Comment: ATP III Classification       Desirable:  < 200 mg/dL               Borderline High:  200 - 239 mg/dL          High:  > = 010 mg/dL   Triglycerides 27.2 0.0 - 149.0 mg/dL    Comment: Normal:  <536 mg/dLBorderline High:  150 - 199 mg/dL   HDL 64.40 >34.74 mg/dL   VLDL 25.9 0.0 - 56.3 mg/dL   LDL Cholesterol 875 (H) 0 - 99 mg/dL   Total CHOL/HDL Ratio 4     Comment:                Men          Women1/2 Average Risk     3.4          3.3Average Risk          5.0          4.42X Average Risk          9.6          7.13X Average Risk          15.0          11.0                       NonHDL 167.33     Comment: NOTE:  Non-HDL goal should be 30 mg/dL higher than patient's LDL goal (i.e. LDL goal of < 70 mg/dL, would have non-HDL goal of < 100 mg/dL)  Hemoglobin I4P     Status: None   Collection Time: 02/04/23  2:53 PM  Result Value Ref Range   Hgb A1c MFr Bld 5.9 4.6 - 6.5 %    Comment: Glycemic Control Guidelines for People with Diabetes:Non Diabetic:  <6%Goal of Therapy: <7%Additional Action Suggested:  >8%       ROS 14 pt review of systems performed and negative (unless mentioned in an HPI)  Objective: BP 112/80   Pulse 82   Temp 98.3 F (36.8 C)   Ht 5\' 4"  (1.626 m)   Wt 207 lb 3.2 oz (94 kg)   LMP 04/18/2023   SpO2 98%   BMI 35.57 kg/m  Physical Exam Vitals and nursing note reviewed.  Constitutional:      General: She is not in acute distress.    Appearance: Normal appearance. She is not ill-appearing or  toxic-appearing.  HENT:     Head: Normocephalic and atraumatic.     Right Ear: Tympanic membrane, ear canal and external ear normal. There is no impacted cerumen.     Left Ear: Tympanic membrane, ear canal and external ear normal. There is no impacted cerumen.     Nose: No congestion or rhinorrhea.     Mouth/Throat:     Mouth: Mucous membranes are moist.     Pharynx: Oropharynx is clear. No oropharyngeal exudate or posterior oropharyngeal erythema.  Eyes:     General: No scleral  icterus.       Right eye: No discharge.        Left eye: No discharge.     Extraocular Movements: Extraocular movements intact.     Conjunctiva/sclera: Conjunctivae normal.     Pupils: Pupils are equal, round, and reactive to light.  Cardiovascular:     Rate and Rhythm: Normal rate and regular rhythm.     Pulses: Normal pulses.     Heart sounds: Normal heart sounds. No murmur heard.    No friction rub. No gallop.  Pulmonary:     Effort: Pulmonary effort is normal. No respiratory distress.     Breath sounds: Normal breath sounds. No stridor. No wheezing, rhonchi or rales.  Chest:     Chest wall: No tenderness.  Abdominal:     General: Abdomen is flat. Bowel sounds are normal. There is no distension.     Palpations: Abdomen is soft. There is no mass.     Tenderness: There is no abdominal tenderness. There is no right CVA tenderness, left CVA tenderness, guarding or rebound.     Hernia: No hernia is present.  Musculoskeletal:        General: No swelling, tenderness or deformity. Normal range of motion.     Cervical back: Normal range of motion and neck supple. No rigidity or tenderness.     Right lower leg: No edema.     Left lower leg: No edema.  Lymphadenopathy:     Cervical: No cervical adenopathy.  Skin:    General: Skin is warm and dry.     Coloration: Skin is not jaundiced or pale.     Findings: No bruising, erythema, lesion or rash.  Neurological:     General: No focal deficit present.      Mental Status: She is alert and oriented to person, place, and time. Mental status is at baseline.     Cranial Nerves: No cranial nerve deficit.     Sensory: No sensory deficit.     Motor: No weakness.     Coordination: Coordination normal.     Gait: Gait normal.     Deep Tendon Reflexes: Reflexes normal.  Psychiatric:        Mood and Affect: Mood normal.        Behavior: Behavior normal.        Thought Content: Thought content normal.        Judgment: Judgment normal.     Assessment/plan: Makayla Kelly is a 48 y.o. female present for cpe and Chronic Conditions/illness Management Routine general medical examination at a health care facility (Primary) - Comprehensive metabolic panel Patient was encouraged to exercise greater than 150 minutes a week. Patient was encouraged to choose a diet filled with fresh fruits and vegetables, and lean meats. AVS provided to patient today for education/recommendation on gender specific health and safety maintenance. Colonoscopy:08/22/2022- 7 follow up- cr. Cirgliano.  Mammogram:scheduled next week. - [facility] ([facility]) Cervical cancer screening: Completed 02/2018 2-5-year follow-up, gynecology Immunizations: Tdap UTD 2020, influenza UTD 2024, discussed Shingrix to start after 50 Infectious disease screening: hiv and hep c screening declined.  Breast cancer screening by mammogram Has scheduled for next week  Hypertension/hypokalemia/family history of heart disease-stroke/elevated lipids- (BMI 35.0-39.9 without comorbidity) E66.9 Patient's lipids were elevated on collection 3 months ago.  She would like to retest today after making behavioral and dietary changes. - Lipid panel Stable Continue maxide as needed - CT CARDIAC SCORING: Score of 0, 04/10/2022 Encourage  heart healthy diet and routine exercise.  Gastroesophageal reflux disease without esophagitis- Encounter for monitoring long-term proton pump inhibitor therapy Continue omeprazole  20 mg daily - Vitamin D (25 hydroxy) - B12 and Folate Panel - Magnesium Encouraged B12 subL (last labs <400)   Primary hypertension/ FH: heart disease/ FH: stroke/hypokalemia/ Stable Continue maxide  - CT CARDIAC SCORING: Score of 0, 04/10/2022 Encourage heart healthy diet and routine exercise. Pt desired: cbc, cmp, tsh, lipids and A1c (she understands A1c may not be covered by insurance)    Return in about 24 weeks (around 10/04/2023) for Routine chronic condition follow-up.  Orders Placed This Encounter  Procedures   Comprehensive metabolic panel   Lipid panel   Vitamin D (25 hydroxy)   B12 and Folate Panel   Magnesium   No orders of the defined types were placed in this encounter.  Referral Orders  No referral(s) requested today     Note is dictated utilizing voice recognition software. Although note has been proof read prior to signing, occasional typographical errors still can be missed. If any questions arise, please do not hesitate to call for verification.  Electronically signed by: Felix Pacini, DO [facility] [place]

## 2023-04-19 NOTE — Patient Instructions (Signed)

## 2023-04-22 ENCOUNTER — Telehealth: Payer: Self-pay | Admitting: Family Medicine

## 2023-04-22 MED ORDER — FOLIC ACID 1 MG PO TABS: 1.0000 mg | ORAL_TABLET | Freq: Every day | ORAL | 3 refills | Status: DC

## 2023-04-22 NOTE — Telephone Encounter (Signed)
 Please call patient: Liver function, kidney function, electrolyte levels are all normal. Cholesterol panel is at goal, much better than a couple months ago.  LDL decreased from 151-117.  Great job. Magnesium levels are normal. Vitamin D levels are low at 23.75.  Recommend adding 2000 units of over-the-counter vitamin D daily Vitamin B12 levels are low at 261.  Would recommend 1000 mcg of over-the-counter B12 sublingual solution.  Must be the sublingual solution or dissolvable tab that is placed underneath the tongue in order to have the best absorption. Folate levels are low at 5.5.  I have called in a daily folate supplement.  Follow-up in 3 months on vitamin deficiencies for recheck with provider.

## 2023-04-25 LAB — HM MAMMOGRAPHY

## 2023-08-13 ENCOUNTER — Other Ambulatory Visit: Payer: Self-pay | Admitting: Family Medicine

## 2023-08-15 ENCOUNTER — Other Ambulatory Visit: Payer: Self-pay | Admitting: Family Medicine

## 2023-10-01 ENCOUNTER — Other Ambulatory Visit: Payer: Self-pay | Admitting: Family Medicine

## 2023-10-01 NOTE — Telephone Encounter (Signed)
 Copied from CRM [id number]. Topic: Clinical - Medication Refill >> Oct 01, 2023  2:54 PM Henretta I wrote: Medication: triamterene -hydrochlorothiazide  (MAXZIDE-25) 37.5-25 MG tablet  Has the patient contacted their pharmacy? Yes (Agent: If no, request that the patient contact the pharmacy for the refill. If patient does not wish to contact the pharmacy document the reason why and proceed with request.) (Agent: If yes, when and what did the pharmacy advise?)  This is the patient's preferred pharmacy:  [id number] - [place], [place] - [address] [place] [address] Phone: [phone] Fax: [phone]  Is this the correct pharmacy for this prescription? Yes If no, delete pharmacy and type the correct one.   Has the prescription been filled recently? No  Is the patient out of the medication? No, patient has 1 pill left will be out starting tomorrow   Has the patient been seen for an appointment in the last year OR does the patient have an upcoming appointment? Yes  Can we respond through MyChart? Yes  Agent: Please be advised that Rx refills may take up to 3 business days. We ask that you follow-up with your pharmacy.

## 2023-10-02 ENCOUNTER — Other Ambulatory Visit: Payer: Self-pay | Admitting: Family Medicine

## 2023-10-02 NOTE — Telephone Encounter (Signed)
 [facility] called and spoke to [place], [facility] about the refill(s) triamterene -hydrochlorothiazide  requested. Advised it was sent on 08/15/23 #90 and asked did she pick this up. He says yes she did and if she's out, a new Rx is needed and patient will have to pay out of pocket for it. Advised I will send this to the provider.

## 2023-10-02 NOTE — Telephone Encounter (Deleted)
 Copied from CRM #[id number]. Topic: Clinical - Prescription Issue >> Oct 02, 2023  1:23 PM Mesmerise C wrote: Reason for CRM: Patient stated she's completely out of triamterene -hydrochlorothiazide  (MAXZIDE-25) 37.5-25 MG tablet states she didn't take more than one a day says she went on vacation and could've potentially lost a bottle but needs her medications, asked if she got her 90ds at pharmacy on 6/19 patient said she's at work and can't check bottle to make sure it was a 90ds but doesn't want to go without

## 2023-10-02 NOTE — Telephone Encounter (Signed)
 [facility] called, they are closed for lunch. Will call back to verify if the patient picked up medication in June.   Copied from CRM #[id number]. Topic: Clinical - Prescription Issue >> Oct 02, 2023  1:23 PM Mesmerise C wrote: Reason for CRM: Patient stated she's completely out of triamterene -hydrochlorothiazide  (MAXZIDE-25) 37.5-25 MG tablet states she didn't take more than one a day says she went on vacation and could've potentially lost a bottle but needs her medications, asked if she got her 90ds at pharmacy on 6/19 patient said she's at work and can't check bottle to make sure it was a 90ds but doesn't want to go without

## 2023-11-08 ENCOUNTER — Other Ambulatory Visit: Payer: Self-pay

## 2023-11-08 MED ORDER — OMEPRAZOLE 20 MG PO CPDR: 20.0000 mg | DELAYED_RELEASE_CAPSULE | Freq: Every day | ORAL | 0 refills | Status: DC

## 2023-11-08 NOTE — Telephone Encounter (Signed)
 LVM for pt to schedule follow up with PCP. Last seen 04/19/23 for her physical. 30 day supply given for grace period

## 2023-12-23 ENCOUNTER — Other Ambulatory Visit: Payer: Self-pay | Admitting: Family Medicine

## 2023-12-23 ENCOUNTER — Ambulatory Visit: Admitting: Family Medicine

## 2023-12-23 NOTE — Telephone Encounter (Signed)
 Copied from CRM [id number]. Topic: Clinical - Medication Question >> Dec 23, 2023  8:29 AM Rea BROCKS wrote: Reason for CRM: omeprazole  (PRILOSEC) 20 MG capsule.   Patient stated stated that her omeprazole  will run out before her Nov 10 appt and she's not sure about the maxzide medicine. Patient had to r/s appt today due to provider being out of office. Patient would like to know would she still be able to get refills before then? 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    [phone] (M)

## 2024-01-02 ENCOUNTER — Other Ambulatory Visit: Payer: Self-pay

## 2024-01-02 ENCOUNTER — Telehealth: Payer: Self-pay

## 2024-01-02 MED ORDER — TRIAMTERENE-HCTZ 37.5-25 MG PO TABS: 1.0000 | ORAL_TABLET | Freq: Every day | ORAL | 0 refills | Status: DC

## 2024-01-02 NOTE — Telephone Encounter (Signed)
 Copied from CRM #[id number]. Topic: Appointments - Transfer of Care >> Jan 02, 2024  3:07 PM Ashley R wrote: Pt is requesting to transfer FROM: Catherine, McGowen [facility]) Pt is requesting to transfer TO: Prentiss Frieze Reason for requested transfer: Disorganization in office, no continuity of care, feedback submitted after most recent visit It is the responsibility of the team the patient would like to transfer to (Dr. Prentiss) to reach out to the patient if for any reason this transfer is not acceptable.

## 2024-01-02 NOTE — Telephone Encounter (Signed)
 Please return patient's call: It appears she has a follow-up with Dr. McGowen in a couple days-in person.  With this (normal we can see what her blood pressure is then and provide her with refills will adjust medications if necessary.  For her future appointments: If she is requiring appointments after 3 PM, she will need to switch to another provider.  If she is in need of a switch, we do have another female doctor at [facility] now -I am not certain what her hours are though.

## 2024-01-02 NOTE — Telephone Encounter (Signed)
 Pt called to speak with manager. I called pt back to find out if there was something that I could help with. Pt states she is needing an appt to get refills but has to work until 3 pm ion the OR. Pt is frustrated because she can not take time off and considering getting new provider. Please advise if pt can have a virtual visit and provide BP reading for chronic care management. 30 day supply of BP meds sent to pharmacy.

## 2024-01-02 NOTE — Telephone Encounter (Unsigned)
 Copied from CRM [id number]. Topic: Complaint (DO NOT CONVERT) - Sensitive >> Jan 02, 2024  7:51 AM Pinkey ORN wrote: Date of Incident: 12/23/23 Details of complaint: Patient states she's needing to be seen in office to refill her blood pressure medication, that she'll be completely out of come Nov 10th. Patient states her provider doesn't have anything available that'll work alongside her work schedule, therefor she scheduled with another provider in office only to be told it has to be completed through her primary care physician.  How would the patient like to see it resolved? Patient wants an appointment sooner than Nov 10th and after 3PM. On a scale of 1-10, how was your experience? 0 What would it take to bring it to a 10? Patient states she just wants to get in and have her medication filled before she runs out.   Route to Research Officer, [employer].

## 2024-01-02 NOTE — Telephone Encounter (Signed)
 Spoke with patient regarding results/recommendations. Pt is going to be doing TOC to Dr. Mcgowen

## 2024-01-02 NOTE — Telephone Encounter (Signed)
 LVM to discuss

## 2024-01-06 ENCOUNTER — Ambulatory Visit: Admitting: Family Medicine

## 2024-01-06 ENCOUNTER — Encounter: Payer: Self-pay | Admitting: Family Medicine

## 2024-01-06 VITALS — BP 113/78 | HR 70 | Temp 98.1°F | Ht 64.0 in | Wt 201.6 lb

## 2024-01-06 DIAGNOSIS — E538 Deficiency of other specified B group vitamins: Secondary | ICD-10-CM | POA: Diagnosis not present

## 2024-01-06 DIAGNOSIS — E559 Vitamin D deficiency, unspecified: Secondary | ICD-10-CM

## 2024-01-06 DIAGNOSIS — K219 Gastro-esophageal reflux disease without esophagitis: Secondary | ICD-10-CM | POA: Diagnosis not present

## 2024-01-06 DIAGNOSIS — I10 Essential (primary) hypertension: Secondary | ICD-10-CM | POA: Diagnosis not present

## 2024-01-06 MED ORDER — OMEPRAZOLE 20 MG PO CPDR: 20.0000 mg | DELAYED_RELEASE_CAPSULE | Freq: Every day | ORAL | 3 refills | Status: AC

## 2024-01-06 MED ORDER — TRIAMTERENE-HCTZ 37.5-25 MG PO TABS: 1.0000 | ORAL_TABLET | Freq: Every day | ORAL | 1 refills | Status: DC

## 2024-01-06 MED ORDER — FOLIC ACID 1 MG PO TABS: 1.0000 mg | ORAL_TABLET | Freq: Every day | ORAL | 3 refills | Status: AC

## 2024-01-06 MED ORDER — TRIAMTERENE-HCTZ 37.5-25 MG PO TABS: 1.0000 | ORAL_TABLET | Freq: Every day | ORAL | 3 refills | Status: AC

## 2024-01-06 NOTE — Addendum Note (Signed)
 Addended by: Quashaun Lazalde A on: 01/06/2024 04:05 PM   Modules accepted: Orders

## 2024-01-06 NOTE — Progress Notes (Signed)
 Office Note 01/06/2024  CC:  Chief Complaint  Patient presents with   Establish Care   Medical Management of Chronic Issues   HPI:  Makayla Kelly is a 48 y.o. female who is here to transfer care, follow-up hypertension and GERD. Patient's most recent primary MD: Dr. Nathalie patient needs afternoon appointments so my schedule is better able to accommodate her scheduling needs. Old records in epic/health Link were reviewed prior to or during today's visit.  [name] feels well.  She checks her blood pressure and it is consistently normal. She takes omeprazole  daily because if she does not she gets significant reflux.  Her most recent CPE was April 19, 2023.  Past Medical History:  Diagnosis Date   Family history of ovarian cancer    Mother, diagnosed age 75   GERD (gastroesophageal reflux disease)    Hypercholesterolemia    Hypertension    Hypokalemia    Obesity, Class II, BMI 35-39.9     Past Surgical History:  Procedure Laterality Date   CHOLECYSTECTOMY  2000   2000   COLONOSCOPY W/ POLYPECTOMY     07/2022 adenoma-->recall 7 yrs   Coronary calcium score     03/2022-->Zero   ESOPHAGOGASTRODUODENOSCOPY     07/2022 gastritis (h pylori NEG) and benign gastric polyps    Family History  Problem Relation Age of Onset   Early death Mother    Ovarian cancer Mother    Hypertension Father    Heart disease Father    Diabetes Father    Arthritis Father    Depression Brother    Stomach cancer Maternal Grandmother    Heart disease Maternal Grandfather    Heart attack Maternal Grandfather    Heart attack Paternal Grandmother    Diabetes Paternal Grandmother    Heart disease Paternal Grandmother    Stroke Paternal Grandfather    Heart disease Paternal Grandfather    Diabetes Paternal Grandfather    Liver disease Neg Hx    Esophageal cancer Neg Hx    Colon cancer Neg Hx     Social History   Socioeconomic History   Marital status: Married    Spouse name: Not  on file   Number of children: 2   Years of education: Not on file   Highest education level: Not on file  Occupational History   Not on file  Tobacco Use   Smoking status: Never    Passive exposure: Never   Smokeless tobacco: Never  Vaping Use   Vaping status: Never Used  Substance and Sexual Activity   Alcohol use: Not Currently   Drug use: Never   Sexual activity: Yes    Partners: Male    Comment: married  Other Topics Concern   Not on file  Social History Narrative   Married, 2 children, longtime [place] resident.   Education/employment: Masters degree, works as a mudlogger at [employer].   No tobacco          Social Drivers of Corporate Investment Banker Strain: Low Risk  (12/26/2018)   Received from [facility] visits prior to 04/28/2022.   Overall Financial Resource Strain (CARDIA)    Difficulty of Paying Living Expenses: Not hard at all  Food Insecurity: No Food Insecurity (12/26/2018)   Received from [facility] visits prior to 04/28/2022.   Hunger Vital Sign    Within the past 12 months, you worried that your food would run out  before you got the money to buy more.: Never true    Within the past 12 months, the food you bought just didn't last and you didn't have money to get more.: Never true  Transportation Needs: No Transportation Needs (12/26/2018)   Received from [facility] visits prior to 04/28/2022.   PRAPARE - Administrator, Civil Service (Medical): No    Lack of Transportation (Non-Medical): No  Physical Activity: Sufficiently Active (12/26/2018)   Received from [facility] visits prior to 04/28/2022.   Exercise Vital Sign    On average, how many days per week do you engage in moderate to strenuous exercise (like a brisk walk)?: 5 days    On average, how many minutes do you engage in exercise at this level?: 50 min  Stress: No  Stress Concern Present (12/26/2018)   Received from [facility] visits prior to 04/28/2022.   [employer] of Occupational Health - Occupational Stress Questionnaire    Feeling of Stress : Not at all  Social Connections: Unknown (07/09/2021)   Received from [facility]   Social Network    Social Network: Not on file  Intimate Partner Violence: Unknown (05/31/2021)   Received from [facility]   HITS    Physically Hurt: Not on file    Insult or Talk Down To: Not on file    Threaten Physical Harm: Not on file    Scream or Curse: Not on file    Outpatient Encounter Medications as of 01/06/2024  Medication Sig   [DISCONTINUED] folic acid  (FOLVITE ) 1 MG tablet Take 1 tablet (1 mg total) by mouth daily.   [DISCONTINUED] omeprazole  (PRILOSEC) 20 MG capsule Take 1 capsule (20 mg total) by mouth daily. OFFICE VISIT NEEDED FOR FURTHER REFILLS   [DISCONTINUED] triamterene -hydrochlorothiazide  (MAXZIDE-25) 37.5-25 MG tablet Take 1 tablet by mouth daily.   folic acid  (FOLVITE ) 1 MG tablet Take 1 tablet (1 mg total) by mouth daily.   omeprazole  (PRILOSEC) 20 MG capsule Take 1 capsule (20 mg total) by mouth daily.   triamterene -hydrochlorothiazide  (MAXZIDE-25) 37.5-25 MG tablet Take 1 tablet by mouth daily.   No facility-administered encounter medications on file as of 01/06/2024.    Allergies  Allergen Reactions   Erythromycin Other (See Comments) and Nausea And Vomiting    Review of Systems  Constitutional:  Negative for appetite change, chills, fatigue and fever.  HENT:  Negative for congestion, dental problem, ear pain and sore throat.   Eyes:  Negative for discharge, redness and visual disturbance.  Respiratory:  Negative for cough, chest tightness, shortness of breath and wheezing.   Cardiovascular:  Negative for chest pain, palpitations and leg swelling.  Gastrointestinal:  Negative for abdominal pain, blood in stool, diarrhea, nausea and vomiting.   Genitourinary:  Negative for difficulty urinating, dysuria, flank pain, frequency, hematuria and urgency.  Musculoskeletal:  Negative for arthralgias, back pain, joint swelling, myalgias and neck stiffness.  Skin:  Negative for pallor and rash.  Neurological:  Negative for dizziness, speech difficulty, weakness and headaches.  Hematological:  Negative for adenopathy. Does not bruise/bleed easily.  Psychiatric/Behavioral:  Negative for confusion and sleep disturbance. The patient is not nervous/anxious.     PE; Blood pressure 113/78, pulse 70, temperature 98.1 F (36.7 C), temperature source Oral, height 5' 4 (1.626 m), weight 201 lb 9.6 oz (91.4 kg), SpO2 100%. Body mass index is 34.6 kg/m.  Physical Exam  Gen: Alert, well appearing.  Patient  is oriented to person, place, time, and situation. AFFECT: pleasant, lucid thought and speech. ZWU:Zbzd: no injection, icteris, swelling, or exudate.  EOMI, PERRLA. Mouth: lips without lesion/swelling.  Oral mucosa pink and moist. Oropharynx without erythema, exudate, or swelling.  CV: RRR, no m/r/g.   LUNGS: CTA bilat, nonlabored resps, Clary aeration in all lung fields. EXT: no clubbing or cyanosis.  no edema.   Pertinent labs:  Last CBC Lab Results  Component Value Date   WBC 5.0 02/04/2023   HGB 14.4 02/04/2023   HCT 44.2 02/04/2023   MCV 85.0 02/04/2023   RDW 14.2 02/04/2023   PLT 178.0 02/04/2023   Last metabolic panel Lab Results  Component Value Date   GLUCOSE 84 04/19/2023   NA 140 04/19/2023   K 4.2 04/19/2023   CL 103 04/19/2023   CO2 30 04/19/2023   BUN 12 04/19/2023   CREATININE 0.78 04/19/2023   GFR 89.45 04/19/2023   CALCIUM 9.3 04/19/2023   PROT 7.1 04/19/2023   ALBUMIN 4.3 04/19/2023   BILITOT 0.5 04/19/2023   ALKPHOS 64 04/19/2023   AST 18 04/19/2023   ALT 17 04/19/2023   Last lipids Lab Results  Component Value Date   CHOL 190 04/19/2023   HDL 54.30 04/19/2023   LDLCALC 117 (H) 04/19/2023   TRIG  91.0 04/19/2023   CHOLHDL 3 04/19/2023   Last hemoglobin A1c Lab Results  Component Value Date   HGBA1C 5.9 02/04/2023   Last thyroid  functions Lab Results  Component Value Date   TSH 1.19 02/04/2023   Last vitamin D  Lab Results  Component Value Date   VD25OH 23.75 (L) 04/19/2023   Last vitamin B12 and Folate Lab Results  Component Value Date   VITAMINB12 261 04/19/2023   FOLATE 5.5 (L) 04/19/2023   ASSESSMENT AND PLAN:   New patient/transferring care.  1.  Hypertension, well-controlled on Maxide 37.5-25, 1 every day (refilled today). CMET, CBC, TSH, hemoglobin A1c, lipid panel today.  #2 GERD.  She gets [place] relief from daily Prilosec 20 mg a day long-term. Refilled today.  3.  History of vitamin B12, vitamin D , and folic acid  deficiency--> monitor levels of all 3 of these today. She takes over-the-counter vitamin D  supplement but she cannot recall what dose right now. She does not take B12 supplement lately. She takes 1 mg of folate acid daily.  An After Visit Summary was printed and given to the patient.  Return in about 1 year (around 01/05/2025) for annual CPE (fasting). Next CPE February/March 2026  Signed:  Gerlene Hockey, MD           01/06/2024

## 2024-01-06 NOTE — Telephone Encounter (Signed)
 FYI. Pt BP in office today was 113/78. Pt is est. Care with Dr. McGowen.

## 2024-01-06 NOTE — Telephone Encounter (Signed)
 Refilled patient's hypertension med for her.

## 2024-01-07 LAB — CBC WITH DIFFERENTIAL/PLATELET
Absolute Lymphocytes: 2349 {cells}/uL (ref 850–3900)
Absolute Monocytes: 531 {cells}/uL (ref 200–950)
Basophils Absolute: 38 {cells}/uL (ref 0–200)
Basophils Relative: 0.6 %
Eosinophils Absolute: 230 {cells}/uL (ref 15–500)
Eosinophils Relative: 3.6 %
HCT: 42.1 % (ref 35.0–45.0)
Hemoglobin: 13.9 g/dL (ref 11.7–15.5)
MCH: 27.6 pg (ref 27.0–33.0)
MCHC: 33 g/dL (ref 32.0–36.0)
MCV: 83.7 fL (ref 80.0–100.0)
MPV: 10 fL (ref 7.5–12.5)
Monocytes Relative: 8.3 %
Neutro Abs: 3251 {cells}/uL (ref 1500–7800)
Neutrophils Relative %: 50.8 %
Platelets: 171 Thousand/uL (ref 140–400)
RBC: 5.03 Million/uL (ref 3.80–5.10)
RDW: 14.3 % (ref 11.0–15.0)
Total Lymphocyte: 36.7 %
WBC: 6.4 Thousand/uL (ref 3.8–10.8)

## 2024-01-07 LAB — COMPREHENSIVE METABOLIC PANEL WITH GFR
AG Ratio: 1.6 (calc) (ref 1.0–2.5)
ALT: 15 U/L (ref 6–29)
AST: 22 U/L (ref 10–35)
Albumin: 4.7 g/dL (ref 3.6–5.1)
Alkaline phosphatase (APISO): 70 U/L (ref 31–125)
BUN: 15 mg/dL (ref 7–25)
CO2: 30 mmol/L (ref 20–32)
Calcium: 9.9 mg/dL (ref 8.6–10.2)
Chloride: 100 mmol/L (ref 98–110)
Creat: 0.88 mg/dL (ref 0.50–0.99)
Globulin: 2.9 g/dL (ref 1.9–3.7)
Glucose, Bld: 88 mg/dL (ref 65–99)
Potassium: 3.9 mmol/L (ref 3.5–5.3)
Sodium: 138 mmol/L (ref 135–146)
Total Bilirubin: 0.4 mg/dL (ref 0.2–1.2)
Total Protein: 7.6 g/dL (ref 6.1–8.1)
eGFR: 81 mL/min/1.73m2 (ref >=60)

## 2024-01-07 LAB — HEMOGLOBIN A1C
Hgb A1c MFr Bld: 5.6 % (ref <5.7)
Mean Plasma Glucose: 114 mg/dL
eAG (mmol/L): 6.3 mmol/L

## 2024-01-07 LAB — VITAMIN B12: Vitamin B-12: 592 pg/mL (ref 200–1100)

## 2024-01-07 LAB — LIPID PANEL
Cholesterol: 200 mg/dL — ABNORMAL HIGH (ref <200)
HDL: 57 mg/dL (ref >=50)
LDL Cholesterol (Calc): 121 mg/dL — ABNORMAL HIGH
Non-HDL Cholesterol (Calc): 143 mg/dL — ABNORMAL HIGH (ref <130)
Total CHOL/HDL Ratio: 3.5 (calc) (ref <5.0)
Triglycerides: 113 mg/dL (ref <150)

## 2024-01-07 LAB — TSH: TSH: 1.49 m[IU]/L

## 2024-01-07 LAB — VITAMIN D 25 HYDROXY (VIT D DEFICIENCY, FRACTURES): Vit D, 25-Hydroxy: 63 ng/mL (ref 30–100)

## 2024-01-07 LAB — FOLATE: Folate: >24 ng/mL

## 2024-01-07 NOTE — Telephone Encounter (Signed)
 Pt est. Care with new provider, refills provided.

## 2024-01-08 ENCOUNTER — Ambulatory Visit: Payer: Self-pay | Admitting: Family Medicine

## 2024-02-19 ENCOUNTER — Ambulatory Visit: Payer: Self-pay

## 2024-02-19 ENCOUNTER — Encounter: Payer: Self-pay | Admitting: Family Medicine

## 2024-02-19 MED ORDER — OSELTAMIVIR PHOSPHATE 75 MG PO CAPS: 75.0000 mg | ORAL_CAPSULE | Freq: Every day | ORAL | 0 refills | Status: AC

## 2024-02-19 NOTE — Telephone Encounter (Signed)
 See MyChart encounter

## 2024-02-19 NOTE — Telephone Encounter (Signed)
 Sure. Rx sent to [facility].

## 2024-02-19 NOTE — Telephone Encounter (Signed)
 FYI Only or Action Required?: Action required by provider: Pt requesting a prescription for Tamiflu  since she is traveling out of town on Satuday. Pt would like a call back..  Patient was last seen in primary care on 01/06/2024 by McGowen, Aleene DEL, MD.  Called Nurse Triage reporting Influenza.  Symptoms began denies symptoms.  Interventions attempted: Nothing.  Symptoms are: stable.  Triage Disposition: Call PCP Within 24 Hours  Patient/caregiver understands and will follow disposition?: No, wishes to speak with PCP  Reason for Disposition  [1] Influenza EXPOSURE within last 48 hours (2 days) AND [2] NOT HIGH RISK AND [3] strongly requests antiviral medication  Answer Assessment - Initial Assessment Questions Patient called and reported daughter was tested positive for influenza B. Pt is not experiencing symptoms but would like a prescription for Tamiflu  since she is traveling out of town Saturday. Writer states she will send this message to her provider and will receive a call from the clinic.   1. TYPE of EXPOSURE: How were you exposed? (e.g., close contact, not a close contact)     Close contact 2. DATE of EXPOSURE: When did the exposure occur? (e.g., hour, days, weeks)     days 3. PREGNANCY: Is there any chance you are pregnant? When was your last menstrual period?     N/a 4. HIGH RISK for COMPLICATIONS: Do you have any heart or lung problems? Do you have a weakened immune system? (e.g., CHF, COPD, asthma, HIV positive, chemotherapy, renal failure, diabetes mellitus, sickle cell anemia)     no 5. SYMPTOMS: Do you have any symptoms? (e.g., cough, fever, sore throat, difficulty breathing).     denies  Protocols used: Influenza Exposure-A-AH  Reason for Triage:  Daughter was diagnosed with Flu A today- PT is going out of town on Saturday and wanting to know if she can get a prescription of Tamaflu sent in to the pharmacy

## 2024-02-21 NOTE — Telephone Encounter (Signed)
 No further action needed.

## 2024-03-02 ENCOUNTER — Encounter: Admitting: Family Medicine

## 2024-03-29 ENCOUNTER — Other Ambulatory Visit: Payer: Self-pay | Admitting: Family Medicine

## 2025-01-06 ENCOUNTER — Encounter: Admitting: Family Medicine
